# Patient Record
Sex: Female | Born: 1965 | Race: White | Hispanic: No | Marital: Married | Smoking: Never smoker
Health system: Southern US, Community
[De-identification: ages and names within clinical notes are randomized; demographics above are authoritative.]

## PROBLEM LIST (undated history)

## (undated) DIAGNOSIS — K625 Hemorrhage of anus and rectum: Secondary | ICD-10-CM

## (undated) DIAGNOSIS — E785 Hyperlipidemia, unspecified: Secondary | ICD-10-CM

## (undated) DIAGNOSIS — M1990 Unspecified osteoarthritis, unspecified site: Secondary | ICD-10-CM

## (undated) DIAGNOSIS — K649 Unspecified hemorrhoids: Secondary | ICD-10-CM

## (undated) DIAGNOSIS — I10 Essential (primary) hypertension: Secondary | ICD-10-CM

## (undated) DIAGNOSIS — K6289 Other specified diseases of anus and rectum: Secondary | ICD-10-CM

## (undated) DIAGNOSIS — F329 Major depressive disorder, single episode, unspecified: Secondary | ICD-10-CM

## (undated) DIAGNOSIS — F32A Depression, unspecified: Secondary | ICD-10-CM

## (undated) DIAGNOSIS — Z8669 Personal history of other diseases of the nervous system and sense organs: Secondary | ICD-10-CM

## (undated) DIAGNOSIS — F419 Anxiety disorder, unspecified: Secondary | ICD-10-CM

## (undated) HISTORY — DX: Anxiety disorder, unspecified: F41.9

## (undated) HISTORY — DX: Hyperlipidemia, unspecified: E78.5

## (undated) HISTORY — DX: Essential (primary) hypertension: I10

## (undated) HISTORY — DX: Unspecified hemorrhoids: K64.9

## (undated) HISTORY — DX: Unspecified osteoarthritis, unspecified site: M19.90

## (undated) HISTORY — DX: Major depressive disorder, single episode, unspecified: F32.9

## (undated) HISTORY — DX: Other specified diseases of anus and rectum: K62.89

## (undated) HISTORY — DX: Hemorrhage of anus and rectum: K62.5

## (undated) HISTORY — DX: Personal history of other diseases of the nervous system and sense organs: Z86.69

## (undated) HISTORY — DX: Depression, unspecified: F32.A

---
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
## 1995-05-31 HISTORY — PX: CHOLECYSTECTOMY: SHX55

## 1997-05-30 HISTORY — PX: TONSILLECTOMY AND ADENOIDECTOMY: SUR1326

## 1998-04-09 ENCOUNTER — Ambulatory Visit (HOSPITAL_BASED_OUTPATIENT_CLINIC_OR_DEPARTMENT_OTHER): Admission: RE | Admit: 1998-04-09 | Discharge: 1998-04-09 | Payer: Self-pay | Admitting: Otolaryngology

## 1998-10-01 ENCOUNTER — Other Ambulatory Visit: Admission: RE | Admit: 1998-10-01 | Discharge: 1998-10-01 | Payer: Self-pay | Admitting: Obstetrics and Gynecology

## 1998-12-29 ENCOUNTER — Emergency Department (HOSPITAL_COMMUNITY): Admission: EM | Admit: 1998-12-29 | Discharge: 1998-12-29 | Payer: Self-pay | Admitting: Emergency Medicine

## 1999-10-29 ENCOUNTER — Other Ambulatory Visit: Admission: RE | Admit: 1999-10-29 | Discharge: 1999-10-29 | Payer: Self-pay | Admitting: Obstetrics and Gynecology

## 1999-11-22 ENCOUNTER — Encounter: Payer: Self-pay | Admitting: Obstetrics and Gynecology

## 1999-11-22 ENCOUNTER — Ambulatory Visit (HOSPITAL_COMMUNITY): Admission: RE | Admit: 1999-11-22 | Discharge: 1999-11-22 | Payer: Self-pay | Admitting: Obstetrics and Gynecology

## 2000-03-21 ENCOUNTER — Ambulatory Visit (HOSPITAL_COMMUNITY): Admission: RE | Admit: 2000-03-21 | Discharge: 2000-03-21 | Payer: Self-pay | Admitting: Anesthesiology

## 2001-01-23 ENCOUNTER — Encounter: Admission: RE | Admit: 2001-01-23 | Discharge: 2001-01-23 | Payer: Self-pay | Admitting: Obstetrics and Gynecology

## 2001-01-23 ENCOUNTER — Encounter: Payer: Self-pay | Admitting: Obstetrics and Gynecology

## 2001-08-20 ENCOUNTER — Other Ambulatory Visit: Admission: RE | Admit: 2001-08-20 | Discharge: 2001-08-20 | Payer: Self-pay | Admitting: Obstetrics and Gynecology

## 2002-08-14 ENCOUNTER — Encounter: Admission: RE | Admit: 2002-08-14 | Discharge: 2002-11-12 | Payer: Self-pay | Admitting: Internal Medicine

## 2002-09-30 ENCOUNTER — Other Ambulatory Visit: Admission: RE | Admit: 2002-09-30 | Discharge: 2002-09-30 | Payer: Self-pay | Admitting: Obstetrics and Gynecology

## 2003-05-31 HISTORY — PX: LAPAROSCOPIC GASTRIC BANDING: SHX1100

## 2003-11-10 ENCOUNTER — Other Ambulatory Visit: Admission: RE | Admit: 2003-11-10 | Discharge: 2003-11-10 | Payer: Self-pay | Admitting: Obstetrics and Gynecology

## 2003-11-11 ENCOUNTER — Encounter: Admission: RE | Admit: 2003-11-11 | Discharge: 2003-11-11 | Payer: Self-pay | Admitting: Obstetrics and Gynecology

## 2003-11-17 ENCOUNTER — Encounter: Admission: RE | Admit: 2003-11-17 | Discharge: 2003-11-17 | Payer: Self-pay | Admitting: *Deleted

## 2003-11-25 ENCOUNTER — Encounter: Admission: RE | Admit: 2003-11-25 | Discharge: 2004-02-23 | Payer: Self-pay | Admitting: *Deleted

## 2003-12-09 ENCOUNTER — Observation Stay (HOSPITAL_COMMUNITY): Admission: RE | Admit: 2003-12-09 | Discharge: 2003-12-10 | Payer: Self-pay | Admitting: *Deleted

## 2004-08-26 ENCOUNTER — Encounter: Admission: RE | Admit: 2004-08-26 | Discharge: 2004-11-24 | Payer: Self-pay | Admitting: *Deleted

## 2004-10-29 ENCOUNTER — Ambulatory Visit (HOSPITAL_COMMUNITY): Admission: RE | Admit: 2004-10-29 | Discharge: 2004-10-29 | Payer: Self-pay | Admitting: Orthopedic Surgery

## 2005-08-22 ENCOUNTER — Encounter: Admission: RE | Admit: 2005-08-22 | Discharge: 2005-08-22 | Payer: Self-pay | Admitting: Obstetrics and Gynecology

## 2005-09-20 ENCOUNTER — Other Ambulatory Visit: Admission: RE | Admit: 2005-09-20 | Discharge: 2005-09-20 | Payer: Self-pay | Admitting: Obstetrics and Gynecology

## 2008-06-18 ENCOUNTER — Encounter: Admission: RE | Admit: 2008-06-18 | Discharge: 2008-06-18 | Payer: Self-pay | Admitting: Obstetrics and Gynecology

## 2009-09-15 ENCOUNTER — Encounter (INDEPENDENT_AMBULATORY_CARE_PROVIDER_SITE_OTHER): Payer: Self-pay | Admitting: Obstetrics and Gynecology

## 2009-09-15 ENCOUNTER — Ambulatory Visit: Payer: Self-pay | Admitting: Vascular Surgery

## 2009-09-15 ENCOUNTER — Ambulatory Visit (HOSPITAL_COMMUNITY): Admission: RE | Admit: 2009-09-15 | Discharge: 2009-09-15 | Payer: Self-pay | Admitting: Obstetrics and Gynecology

## 2010-02-10 ENCOUNTER — Encounter: Admission: RE | Admit: 2010-02-10 | Discharge: 2010-02-10 | Payer: Self-pay | Admitting: Obstetrics and Gynecology

## 2010-05-04 ENCOUNTER — Encounter (HOSPITAL_COMMUNITY)
Admission: RE | Admit: 2010-05-04 | Discharge: 2010-06-29 | Payer: Self-pay | Source: Home / Self Care | Attending: Obstetrics and Gynecology | Admitting: Obstetrics and Gynecology

## 2010-06-20 ENCOUNTER — Encounter: Payer: Self-pay | Admitting: Obstetrics and Gynecology

## 2010-06-20 ENCOUNTER — Encounter: Payer: Self-pay | Admitting: Orthopedic Surgery

## 2010-08-17 ENCOUNTER — Ambulatory Visit: Payer: Self-pay | Admitting: Hematology & Oncology

## 2010-09-02 ENCOUNTER — Other Ambulatory Visit: Payer: Self-pay | Admitting: Hematology & Oncology

## 2010-09-02 ENCOUNTER — Ambulatory Visit (HOSPITAL_BASED_OUTPATIENT_CLINIC_OR_DEPARTMENT_OTHER): Payer: BC Managed Care – PPO | Admitting: Hematology & Oncology

## 2010-09-02 DIAGNOSIS — D509 Iron deficiency anemia, unspecified: Secondary | ICD-10-CM

## 2010-09-02 DIAGNOSIS — E282 Polycystic ovarian syndrome: Secondary | ICD-10-CM

## 2010-09-02 DIAGNOSIS — N920 Excessive and frequent menstruation with regular cycle: Secondary | ICD-10-CM

## 2010-09-02 DIAGNOSIS — F329 Major depressive disorder, single episode, unspecified: Secondary | ICD-10-CM

## 2010-09-02 LAB — IRON AND TIBC
%SAT: 24 % (ref 20–55)
TIBC: 362 ug/dL (ref 250–470)
UIBC: 276 ug/dL

## 2010-09-02 LAB — CBC WITH DIFFERENTIAL (CANCER CENTER ONLY)
BASO#: 0 10*3/uL (ref 0.0–0.2)
BASO%: 0.5 % (ref 0.0–2.0)
EOS%: 4.3 % (ref 0.0–7.0)
HCT: 43 % (ref 34.8–46.6)
LYMPH#: 2.2 10*3/uL (ref 0.9–3.3)
LYMPH%: 34.3 % (ref 14.0–48.0)
MCV: 88 fL (ref 81–101)
MONO#: 0.4 10*3/uL (ref 0.1–0.9)
MONO%: 6 % (ref 0.0–13.0)
RBC: 4.89 10*6/uL (ref 3.70–5.32)
WBC: 6.5 10*3/uL (ref 3.9–10.0)

## 2010-09-02 LAB — RETICULOCYTES (CHCC): Retic Ct Pct: 1 % (ref 0.4–3.1)

## 2010-09-02 LAB — FERRITIN: Ferritin: 130 ng/mL (ref 10–291)

## 2010-09-02 LAB — CHCC SATELLITE - SMEAR

## 2010-09-03 ENCOUNTER — Other Ambulatory Visit: Payer: Self-pay | Admitting: Hematology & Oncology

## 2010-09-03 LAB — TSH: TSH: 1.546 u[IU]/mL (ref 0.350–4.500)

## 2010-10-15 NOTE — Op Note (Signed)
NAME:  Susan Thompson, Susan Thompson                         ACCOUNT NO.:  [id number]   MEDICAL RECORD NO.:  [id number]                   PATIENT TYPE:  OBV   LOCATION:  0460                                 FACILITY:  [facility]   PHYSICIAN:  Vikki Ports, M.D.         DATE OF BIRTH:  Dec 07, 1965   DATE OF PROCEDURE:  12/09/2003  DATE OF DISCHARGE:  12/10/2003                                 OPERATIVE REPORT   PREOPERATIVE DIAGNOSES:  1. Morbid obesity.  2. Hiatal hernia.   POSTOPERATIVE DIAGNOSES:  1. Morbid obesity.  2. Hiatal hernia.   PROCEDURE:  Laparoscopic adjustable gastric banding and hiatal hernia  repair.   SURGEON:  Vikki Ports, M.D.   ASSISTANT:  Thornton Park. Daphine Deutscher, M.D.   ANESTHESIA:  General.   DESCRIPTION OF PROCEDURE:  The patient was taken to the operating room,  placed in the supine position and after adequate general anesthesia was  induced using endotracheal tube, the abdomen was prepped and draped in the  normal sterile fashion.  Using a 5 mm Optiview trocar, access was obtained  in the left upper quadrant. Pneumoperitoneum was obtained.  Under direct  visualization, an additional 10 mm port was placed in the right upper  quadrant and a 5 mm port was placed in the right upper quadrant. A 12 mm  port was placed in the right supraumbilical region. An additional 5 mm port  was placed in the left abdomen. A subxiphoid 5 mm incision was made and  Nathanson retractor was introduced into the abdomen. The left lateral  segment of the liver was retracted anteriorly. The angle of His was easily  identified and dissected with sharp and blunt dissection.  I then turned my  attention to the right crus and a plane anterior to the right crus and  posterior to the esophagus was traversed with the lap band passer. The band  was then placed in the abdomen through the 12 mm incision, placed in the lap  band passer and brought around behind the esophagus. It was closed over  the  dilating balloon, insufflated to 10 mL.  It snapped closed nicely and had  easy mobility. The balloon was then removed. The band was then retracted  anteriorly giving good visualization of both crus. The hiatal hernia was  visualized and closed with an #0 Ethibond figure-of-eight suture.  The band  was then sutured in place by three separate 2-0 Ethibond sutures  approximating the proximal pouch to the distal stomach in a serosal fashion.  This left clear visualization of the buckle away from the  last stitch. The  band tubing was then brought out through the 12 mm trocar site. A Nathanson  retractor was removed and trocars were removed.  The incision was extended  laterally and a pocket was created on the anterior rectus fascia. The port  was intact in the fascia with interrupted 2-0 Prolene sutures. The port lay  nicely flat against the fascia. The  incision was closed with a subcutaneous 3-0 Vicryl and incisions were closed  with staples and injected with Marcaine. A sterile dressing was applied. The  patient tolerated the procedure well and went to PACU in good condition.                                               Vikki Ports, M.D.    KRH/MEDQ  D:  12/10/2003  T:  12/10/2003  Job:  [id number]

## 2011-04-28 ENCOUNTER — Other Ambulatory Visit: Payer: Self-pay | Admitting: Obstetrics and Gynecology

## 2011-04-28 DIAGNOSIS — Z1231 Encounter for screening mammogram for malignant neoplasm of breast: Secondary | ICD-10-CM

## 2011-06-02 ENCOUNTER — Ambulatory Visit: Payer: BC Managed Care – PPO

## 2011-08-17 ENCOUNTER — Ambulatory Visit (INDEPENDENT_AMBULATORY_CARE_PROVIDER_SITE_OTHER): Payer: BC Managed Care – PPO | Admitting: Physician Assistant

## 2011-08-17 VITALS — BP 130/78 | HR 84 | Temp 98.4°F | Resp 20 | Ht 65.0 in | Wt 212.0 lb

## 2011-08-17 DIAGNOSIS — L299 Pruritus, unspecified: Secondary | ICD-10-CM

## 2011-08-17 MED ORDER — PREDNISONE (PAK) 10 MG PO TABS: ORAL_TABLET | ORAL | Status: DC

## 2011-08-17 MED ORDER — HYDROXYZINE HCL 25 MG PO TABS: 25.0000 mg | ORAL_TABLET | Freq: Three times a day (TID) | ORAL | Status: AC | PRN

## 2011-08-17 MED ORDER — EPINEPHRINE 0.3 MG/0.3ML IJ DEVI: 0.3000 mg | Freq: Once | INTRAMUSCULAR | Status: DC

## 2011-08-17 MED ORDER — METHYLPREDNISOLONE SODIUM SUCC 125 MG IJ SOLR
125.0000 mg | Freq: Once | INTRAMUSCULAR | Status: AC
  Administered 2011-08-17: 125 mg via INTRAMUSCULAR

## 2011-08-17 NOTE — Progress Notes (Signed)
  Subjective:    Patient ID: Susan Thompson, female    DOB: 1965-07-04, 46 y.o.   MRN: [id number]  HPI Susan Thompson comes in today for 3 days of itching all over. No rash except after she scratches a lot.  Has taken benadryl with some relief.  She has not had itching like this before.  No swelling in mouth or SOB.     Review of Systems Itching    Objective:   Physical Exam  Constitutional: She appears well-developed and well-nourished.  HENT:  Mouth/Throat: Oropharynx is clear and moist.  Pulmonary/Chest: Effort normal and breath sounds normal.  Skin: Skin is warm. There is erythema (where scratches.  Positive dermatographism).        Assessment & Plan:  Pruitis Dermatographism  Zyrtec once a day Atarax 25 mg at night prn Zantac bid Soulmedrol 125 Im no Prednisone 6 day taper if no relief 24 hours Pt would like Epi pen after we discussed.

## 2011-08-17 NOTE — Patient Instructions (Signed)
Take Zyrtec once a day in the morning. Take Zantac 150 mg every 12 hours. Take prednisone if symptoms are not better in 1 day

## 2012-05-31 ENCOUNTER — Encounter (INDEPENDENT_AMBULATORY_CARE_PROVIDER_SITE_OTHER): Payer: Self-pay | Admitting: General Surgery

## 2012-05-31 ENCOUNTER — Ambulatory Visit (INDEPENDENT_AMBULATORY_CARE_PROVIDER_SITE_OTHER): Payer: BC Managed Care – PPO | Admitting: General Surgery

## 2012-05-31 VITALS — BP 132/80 | HR 72 | Temp 97.8°F | Resp 18 | Ht 65.0 in | Wt 201.5 lb

## 2012-05-31 DIAGNOSIS — K602 Anal fissure, unspecified: Secondary | ICD-10-CM | POA: Insufficient documentation

## 2012-05-31 MED ORDER — POLYETHYLENE GLYCOL 3350 17 GM/SCOOP PO POWD: 17.0000 g | Freq: Every day | ORAL | Status: DC

## 2012-05-31 MED ORDER — LIDOCAINE HCL 2 % EX GEL: CUTANEOUS | Status: AC

## 2012-05-31 NOTE — Assessment & Plan Note (Signed)
Diltiazem cream 4 times/day.    Add miralax Sitz baths Stool softeners as she has been doing.  Follow up with Dr. Abbey Chatters or Dr. Maisie Fus.

## 2012-05-31 NOTE — Patient Instructions (Signed)
Sitz baths 20 min twice daily.    Continue stool softeners 2 x day  Add miralax 17 gm daily  Diltiazem cream 4 x daily.  Lidocaine jelly as needed.    8 glasses water/day.

## 2012-05-31 NOTE — Progress Notes (Signed)
Patient ID: Susan Thompson, female   DOB: 08-09-1965, 47 y.o.   MRN: [id number]  Chief Complaint  Patient presents with  . Follow-up    Hemorrhoids    HPI Susan Thompson is a 47 y.o. female.    HPI Pt presents with 2 weeks of severe anal pain.  She has had some constipation, but has started taking stool softeners.  She tried proctofoam, but this did not help.  She has had minimal bleeding.  She tried a few sitz baths as well.  She has had hemorrhoids for a long time and has trouble cleaning, but has never had severe pain like this before.  She has not been spending a long time on the toilet recently, but did have severe constipation at the onset of the pain.     Past Medical History  Diagnosis Date  . Hemorrhoids   . Arthritis   . Hyperlipidemia   . Rectal pain   . History of migraine   . Rectal bleeding     Past Surgical History  Procedure Date  . Appendectomy 1982  . Cholecystectomy 1997  . Laparoscopic gastric banding 2005  . Tonsillectomy and adenoidectomy 1999    History reviewed. No pertinent family history.  Social History History  Substance Use Topics  . Smoking status: Never Smoker   . Smokeless tobacco: Not on file  . Alcohol Use: Yes     Comment: rarely    Allergies  Allergen Reactions  . Flagyl (Metronidazole Hcl) Hives    Hives will spread all over the body. Had to use epi pen to resolve because the hives were so bad. Breathing not affected.    Current Outpatient Prescriptions  Medication Sig Dispense Refill  . norgestrel-ethinyl estradiol (CRYSELLE-28) 0.3-30 MG-MCG tablet Take 1 tablet by mouth daily.      Marland Kitchen EPINEPHrine (EPI-PEN) 0.3 mg/0.3 mL DEVI Inject 0.3 mLs (0.3 mg total) into the muscle once.  1 Device  0  . lidocaine (XYLOCAINE JELLY) 2 % jelly Apply to affected area daily prn  30 mL  1  . polyethylene glycol powder (MIRALAX) powder Take 17 g by mouth daily. To correct constipation.  Adjust dose over 1-2 months.  Goal = ~1 bowel movement /  day  255 g  0  . spironolactone (ALDACTONE) 50 MG tablet Take 50 mg by mouth daily.      Marland Kitchen venlafaxine (EFFEXOR-XR) 150 MG 24 hr capsule Take 150 mg by mouth daily.        Review of Systems Review of Systems  Gastrointestinal: Positive for blood in stool and rectal pain.  Neurological: Positive for headaches.  All other systems reviewed and are negative.    Blood pressure 132/80, pulse 72, temperature 97.8 F (36.6 C), temperature source Temporal, resp. rate 18, height 5\' 5"  (1.651 m), weight 201 lb 8 oz (91.4 kg).  Physical Exam Physical Exam  Constitutional: She is oriented to person, place, and time. She appears well-developed and well-nourished. No distress.  HENT:  Head: Normocephalic and atraumatic.  Eyes: Pupils are equal, round, and reactive to light.  Cardiovascular: Normal rate.   Pulmonary/Chest: No respiratory distress.  Genitourinary:          Large anterior external hemorrhoids.  Pt able to tolerate digital exam with minimal discomfort  Anoscopy reveals right posterolateral fissure fairly deep in anal canal and circumferential internal hemorrhoids.    Musculoskeletal: Normal range of motion.  Neurological: She is alert and oriented to person, place, and time.  No cranial nerve deficit.  Skin: She is not diaphoretic.  Psychiatric: She has a normal mood and affect. Her behavior is normal. Judgment and thought content normal.    Assessment/Plan Anal fissure Diltiazem cream 4 times/day.    Add miralax Sitz baths Stool softeners as she has been doing.  Follow up with Dr. Abbey Chatters or Dr. Maisie Fus.      [name] 05/31/2012, 4:23 PM

## 2012-06-17 ENCOUNTER — Other Ambulatory Visit (INDEPENDENT_AMBULATORY_CARE_PROVIDER_SITE_OTHER): Payer: Self-pay | Admitting: General Surgery

## 2012-06-22 ENCOUNTER — Telehealth (INDEPENDENT_AMBULATORY_CARE_PROVIDER_SITE_OTHER): Payer: Self-pay | Admitting: General Surgery

## 2012-06-22 NOTE — Telephone Encounter (Signed)
Pt requesting refill on polyethylene glycol.  Refill request faxed to [facility] w/ 3 additional refills.

## 2012-09-06 ENCOUNTER — Other Ambulatory Visit (INDEPENDENT_AMBULATORY_CARE_PROVIDER_SITE_OTHER): Payer: Self-pay | Admitting: General Surgery

## 2013-07-13 ENCOUNTER — Encounter (HOSPITAL_BASED_OUTPATIENT_CLINIC_OR_DEPARTMENT_OTHER): Payer: Self-pay | Admitting: Emergency Medicine

## 2013-07-13 ENCOUNTER — Emergency Department (HOSPITAL_BASED_OUTPATIENT_CLINIC_OR_DEPARTMENT_OTHER)
Admission: EM | Admit: 2013-07-13 | Discharge: 2013-07-13 | Disposition: A | Discharge status: Home / Self Care | Payer: BC Managed Care – PPO | Attending: Emergency Medicine | Admitting: Emergency Medicine

## 2013-07-13 DIAGNOSIS — Z9089 Acquired absence of other organs: Secondary | ICD-10-CM | POA: Insufficient documentation

## 2013-07-13 DIAGNOSIS — R1110 Vomiting, unspecified: Secondary | ICD-10-CM

## 2013-07-13 DIAGNOSIS — Z9889 Other specified postprocedural states: Secondary | ICD-10-CM | POA: Insufficient documentation

## 2013-07-13 DIAGNOSIS — Z3202 Encounter for pregnancy test, result negative: Secondary | ICD-10-CM | POA: Insufficient documentation

## 2013-07-13 DIAGNOSIS — Z8719 Personal history of other diseases of the digestive system: Secondary | ICD-10-CM | POA: Insufficient documentation

## 2013-07-13 DIAGNOSIS — Z8739 Personal history of other diseases of the musculoskeletal system and connective tissue: Secondary | ICD-10-CM | POA: Insufficient documentation

## 2013-07-13 DIAGNOSIS — Z862 Personal history of diseases of the blood and blood-forming organs and certain disorders involving the immune mechanism: Secondary | ICD-10-CM | POA: Insufficient documentation

## 2013-07-13 DIAGNOSIS — R5383 Other fatigue: Secondary | ICD-10-CM

## 2013-07-13 DIAGNOSIS — N390 Urinary tract infection, site not specified: Secondary | ICD-10-CM | POA: Insufficient documentation

## 2013-07-13 DIAGNOSIS — R5381 Other malaise: Secondary | ICD-10-CM | POA: Insufficient documentation

## 2013-07-13 DIAGNOSIS — Z79899 Other long term (current) drug therapy: Secondary | ICD-10-CM | POA: Insufficient documentation

## 2013-07-13 DIAGNOSIS — Z8679 Personal history of other diseases of the circulatory system: Secondary | ICD-10-CM | POA: Insufficient documentation

## 2013-07-13 DIAGNOSIS — Z8639 Personal history of other endocrine, nutritional and metabolic disease: Secondary | ICD-10-CM | POA: Insufficient documentation

## 2013-07-13 LAB — URINALYSIS, ROUTINE W REFLEX MICROSCOPIC
GLUCOSE, UA: NEGATIVE mg/dL
Hgb urine dipstick: NEGATIVE
Ketones, ur: 15 mg/dL — AB
NITRITE: NEGATIVE
PROTEIN: NEGATIVE mg/dL
Specific Gravity, Urine: 1.026 (ref 1.005–1.030)
UROBILINOGEN UA: 0.2 mg/dL (ref 0.0–1.0)
pH: 5 (ref 5.0–8.0)

## 2013-07-13 LAB — COMPREHENSIVE METABOLIC PANEL
ALBUMIN: 4 g/dL (ref 3.5–5.2)
ALT: 11 U/L (ref 0–35)
AST: 13 U/L (ref 0–37)
Alkaline Phosphatase: 91 U/L (ref 39–117)
BILIRUBIN TOTAL: 0.5 mg/dL (ref 0.3–1.2)
BUN: 16 mg/dL (ref 6–23)
CALCIUM: 9.5 mg/dL (ref 8.4–10.5)
CO2: 20 meq/L (ref 19–32)
CREATININE: 0.7 mg/dL (ref 0.50–1.10)
Chloride: 100 mEq/L (ref 96–112)
GFR calc Af Amer: >90 mL/min (ref >90)
GFR calc non Af Amer: >90 mL/min (ref >90)
GLUCOSE: 132 mg/dL — AB (ref 70–99)
Potassium: 3.8 mEq/L (ref 3.7–5.3)
SODIUM: 139 meq/L (ref 137–147)
Total Protein: 8.3 g/dL (ref 6.0–8.3)

## 2013-07-13 LAB — CBC WITH DIFFERENTIAL/PLATELET
BASOS PCT: 0 % (ref 0–1)
Basophils Absolute: 0 10*3/uL (ref 0.0–0.1)
Eosinophils Absolute: 0.2 10*3/uL (ref 0.0–0.7)
Eosinophils Relative: 1 % (ref 0–5)
HCT: 44.4 % (ref 36.0–46.0)
HEMOGLOBIN: 14.6 g/dL (ref 12.0–15.0)
LYMPHS PCT: 4 % — AB (ref 12–46)
Lymphs Abs: 0.7 10*3/uL (ref 0.7–4.0)
MCH: 27.2 pg (ref 26.0–34.0)
MCHC: 32.9 g/dL (ref 30.0–36.0)
MCV: 82.8 fL (ref 78.0–100.0)
MONO ABS: 0.9 10*3/uL (ref 0.1–1.0)
MONOS PCT: 6 % (ref 3–12)
NEUTROS ABS: 13.8 10*3/uL — AB (ref 1.7–7.7)
Neutrophils Relative %: 89 % — ABNORMAL HIGH (ref 43–77)
Platelets: 332 10*3/uL (ref 150–400)
RBC: 5.36 MIL/uL — AB (ref 3.87–5.11)
RDW: 15.6 % — ABNORMAL HIGH (ref 11.5–15.5)
WBC: 15.5 10*3/uL — AB (ref 4.0–10.5)

## 2013-07-13 LAB — URINE MICROSCOPIC-ADD ON

## 2013-07-13 LAB — LIPASE, BLOOD: LIPASE: 44 U/L (ref 11–59)

## 2013-07-13 LAB — PREGNANCY, URINE: PREG TEST UR: NEGATIVE

## 2013-07-13 MED ORDER — SULFAMETHOXAZOLE-TRIMETHOPRIM 800-160 MG PO TABS: 1.0000 | ORAL_TABLET | Freq: Two times a day (BID) | ORAL | Status: AC

## 2013-07-13 MED ORDER — ONDANSETRON HCL 4 MG/2ML IJ SOLN
4.0000 mg | Freq: Once | INTRAMUSCULAR | Status: AC
  Administered 2013-07-13: 4 mg via INTRAVENOUS
  Filled 2013-07-13: qty 2

## 2013-07-13 MED ORDER — FENTANYL CITRATE 0.05 MG/ML IJ SOLN
50.0000 ug | Freq: Once | INTRAMUSCULAR | Status: AC
  Administered 2013-07-13: 50 ug via INTRAVENOUS
  Filled 2013-07-13: qty 2

## 2013-07-13 MED ORDER — ONDANSETRON HCL 4 MG PO TABS: 4.0000 mg | ORAL_TABLET | Freq: Three times a day (TID) | ORAL | Status: DC | PRN

## 2013-07-13 MED ORDER — SULFAMETHOXAZOLE-TMP DS 800-160 MG PO TABS
1.0000 | ORAL_TABLET | Freq: Once | ORAL | Status: AC
  Administered 2013-07-13: 1 via ORAL
  Filled 2013-07-13: qty 1

## 2013-07-13 NOTE — ED Notes (Signed)
Onset of nausea, generalized weakness, dry heaving this afternoon.  Unsure of fever.  Denies chest pain, diarrhea.  Reports upper abdominal pain which began after the nausea.

## 2013-07-13 NOTE — ED Provider Notes (Signed)
CSN: [id number]     Arrival date & time 07/13/13  1950 History  This chart was scribed for Susan Thompson B. Bernette MayersSheldon, MD by Danella Maiersaroline Early, ED Scribe. This patient was seen in room MH10/MH10 and the patient's care was started at 9:34 PM.    Chief Complaint  Patient presents with  . Nausea   The history is provided by the patient. No language interpreter was used.   HPI Comments: Susan ForthLeslie B Thompson is a 48 y.o. female who presents to the Emergency Department complaining of sudden-onset nausea, dry-heaving, and generalized weakness onset this afternoon a couple hours after eating. She reports upper abdominal pain that began after the initial nausea that she is still having currently. She has been feeling fine the last couple of days and denies pain or nausea after eating before today. She has a h/o appendectomy, cholecystectomy, and lap band. She denies CP, diarrhea, blood in stools, dysuria, rash, fever.   Past Medical History  Diagnosis Date  . Hemorrhoids   . Arthritis   . Hyperlipidemia   . Rectal pain   . History of migraine   . Rectal bleeding    Past Surgical History  Procedure Laterality Date  . Appendectomy  1982  . Cholecystectomy  1997  . Laparoscopic gastric banding  2005  . Tonsillectomy and adenoidectomy  1999   No family history on file. History  Substance Use Topics  . Smoking status: Never Smoker   . Smokeless tobacco: Not on file  . Alcohol Use: Yes     Comment: rarely   OB History   Grav Para Term Preterm Abortions TAB SAB Ect Mult Living                 Review of Systems  Constitutional: Negative for fever.  Cardiovascular: Negative for chest pain.  Gastrointestinal: Positive for nausea, vomiting (dry heaving) and abdominal pain. Negative for diarrhea and blood in stool.  Genitourinary: Negative for dysuria.  Skin: Negative for rash.  Neurological: Positive for weakness.   A complete 10 system review of systems was obtained and all systems are negative except  as noted in the HPI and PMH.     Allergies  Flagyl  Home Medications   Current Outpatient Rx  Name  Route  Sig  Dispense  Refill  . metFORMIN (GLUCOPHAGE) 1000 MG tablet   Oral   Take 1,000 mg by mouth 2 (two) times daily with a meal.         . EPINEPHrine (EPI-PEN) 0.3 mg/0.3 mL DEVI   Intramuscular   Inject 0.3 mLs (0.3 mg total) into the muscle once.   1 Device   0   . norgestrel-ethinyl estradiol (CRYSELLE-28) 0.3-30 MG-MCG tablet   Oral   Take 1 tablet by mouth daily.         . polyethylene glycol powder (GLYCOLAX/MIRALAX) powder      TAKE 17 GRAMS BY MOUTH DAILY TO CORRECT CONSTIPATION   255 g   3   . spironolactone (ALDACTONE) 50 MG tablet   Oral   Take 50 mg by mouth daily.         Marland Kitchen. venlafaxine (EFFEXOR-XR) 150 MG 24 hr capsule   Oral   Take 150 mg by mouth daily.          BP 113/79  Pulse 86  Temp(Src) 97.9 F (36.6 C) (Oral)  Resp 20  Ht 5\' 4"  (1.626 m)  Wt 200 lb (90.719 kg)  BMI 34.31 kg/m2  SpO2 98%  LMP 07/01/2013 Physical Exam  Nursing note and vitals reviewed. Constitutional: She is oriented to person, place, and time. She appears well-developed and well-nourished.  HENT:  Head: Normocephalic and atraumatic.  Eyes: EOM are normal. Pupils are equal, round, and reactive to light.  Neck: Normal range of motion. Neck supple.  Cardiovascular: Normal rate, normal heart sounds and intact distal pulses.   Pulmonary/Chest: Effort normal and breath sounds normal.  Abdominal: Bowel sounds are normal. She exhibits no distension. There is tenderness (mild epigastric). There is no guarding.  Musculoskeletal: Normal range of motion. She exhibits no edema and no tenderness.  Neurological: She is alert and oriented to person, place, and time. She has normal strength. No cranial nerve deficit or sensory deficit.  Skin: Skin is warm and dry. No rash noted.  Psychiatric: She has a normal mood and affect.    ED Course  Procedures (including  critical care time) Medications  ondansetron (ZOFRAN) injection 4 mg (4 mg Intravenous Given 07/13/13 2112)   DIAGNOSTIC STUDIES: Oxygen Saturation is 98% on RA, normal by my interpretation.    COORDINATION OF CARE: 9:55 PM- Discussed treatment plan with pt. Pt agrees to plan.    Labs Review Labs Reviewed  CBC WITH DIFFERENTIAL - Abnormal; Notable for the following:    WBC 15.5 (*)    RBC 5.36 (*)    RDW 15.6 (*)    Neutrophils Relative % 89 (*)    Neutro Abs 13.8 (*)    Lymphocytes Relative 4 (*)    All other components within normal limits  COMPREHENSIVE METABOLIC PANEL - Abnormal; Notable for the following:    Glucose, Bld 132 (*)    All other components within normal limits  LIPASE, BLOOD  URINALYSIS, ROUTINE W REFLEX MICROSCOPIC  PREGNANCY, URINE   Imaging Review No results found.  EKG Interpretation    Date/Time:  Saturday July 13 2013 20:18:39 EST Ventricular Rate:  86 PR Interval:  162 QRS Duration: 88 QT Interval:  380 QTC Calculation: 454 R Axis:   57 Text Interpretation:  Normal sinus rhythm Cannot rule out Anterior infarct , age undetermined \\E \ Nonspecific ST abnormality Abnormal ECG Since last tracing Nonspecific ST abnormality III NOW PRESENT Confirmed by Bernette Mayers  MD, Charon Akamine [phone]) on 07/13/2013 8:20:58 PM            MDM   Final diagnoses:  UTI (urinary tract infection)  Vomiting   Labs unremarkable except for ?UTI in contaminated sample. Will send for culture, start Bactrim, Zofran for nausea at home. Pt is feeling much better now, tolerating ice chips and trying gingerale now. Has walked to the bathroom without dizziness.   I personally performed the services described in this documentation, which was scribed in my presence. The recorded information has been reviewed and is accurate.      Susan Alia B. Bernette Mayers, MD 07/13/13 2310

## 2013-07-13 NOTE — Discharge Instructions (Signed)
Nausea and Vomiting °Nausea is a sick feeling that often comes before throwing up (vomiting). Vomiting is a reflex where stomach contents come out of your mouth. Vomiting can cause severe loss of body fluids (dehydration). Children and elderly adults can become dehydrated quickly, especially if they also have diarrhea. Nausea and vomiting are symptoms of a condition or disease. It is important to find the cause of your symptoms. °CAUSES  °· Direct irritation of the stomach lining. This irritation can result from increased acid production (gastroesophageal reflux disease), infection, food poisoning, taking certain medicines (such as nonsteroidal anti-inflammatory drugs), alcohol use, or tobacco use. °· Signals from the brain. These signals could be caused by a headache, heat exposure, an inner ear disturbance, increased pressure in the brain from injury, infection, a tumor, or a concussion, pain, emotional stimulus, or metabolic problems. °· An obstruction in the gastrointestinal tract (bowel obstruction). °· Illnesses such as diabetes, hepatitis, gallbladder problems, appendicitis, kidney problems, cancer, sepsis, atypical symptoms of a heart attack, or eating disorders. °· Medical treatments such as chemotherapy and radiation. °· Receiving medicine that makes you sleep (general anesthetic) during surgery. °DIAGNOSIS °Your caregiver may ask for tests to be done if the problems do not improve after a few days. Tests may also be done if symptoms are severe or if the reason for the nausea and vomiting is not clear. Tests may include: °· Urine tests. °· Blood tests. °· Stool tests. °· Cultures (to look for evidence of infection). °· X-rays or other imaging studies. °Test results can help your caregiver make decisions about treatment or the need for additional tests. °TREATMENT °You need to stay well hydrated. Drink frequently but in small amounts. You may wish to drink water, sports drinks, clear broth, or eat frozen  ice pops or gelatin dessert to help stay hydrated. When you eat, eating slowly may help prevent nausea. There are also some antinausea medicines that may help prevent nausea. °HOME CARE INSTRUCTIONS  °· Take all medicine as directed by your caregiver. °· If you do not have an appetite, do not force yourself to eat. However, you must continue to drink fluids. °· If you have an appetite, eat a normal diet unless your caregiver tells you differently. °· Eat a variety of complex carbohydrates (rice, wheat, potatoes, bread), lean meats, yogurt, fruits, and vegetables. °· Avoid high-fat foods because they are more difficult to digest. °· Drink enough water and fluids to keep your urine clear or pale yellow. °· If you are dehydrated, ask your caregiver for specific rehydration instructions. Signs of dehydration may include: °· Severe thirst. °· Dry lips and mouth. °· Dizziness. °· Dark urine. °· Decreasing urine frequency and amount. °· Confusion. °· Rapid breathing or pulse. °SEEK IMMEDIATE MEDICAL CARE IF:  °· You have blood or brown flecks (like coffee grounds) in your vomit. °· You have black or bloody stools. °· You have a severe headache or stiff neck. °· You are confused. °· You have severe abdominal pain. °· You have chest pain or trouble breathing. °· You do not urinate at least once every 8 hours. °· You develop cold or clammy skin. °· You continue to vomit for longer than 24 to 48 hours. °· You have a fever. °MAKE SURE YOU:  °· Understand these instructions. °· Will watch your condition. °· Will get help right away if you are not doing well or get worse. °Document Released: 05/16/2005 Document Revised: 08/08/2011 Document Reviewed: 10/13/2010 °ExitCare® Patient Information ©2014 [employer]. ° °Urinary Tract Infection °Urinary   tract infections (UTIs) can develop anywhere along your urinary tract. Your urinary tract is your body's drainage system for removing wastes and extra water. Your urinary tract includes  two kidneys, two ureters, a bladder, and a urethra. Your kidneys are a pair of bean-shaped organs. Each kidney is about the size of your fist. They are located below your ribs, one on each side of your spine. °CAUSES °Infections are caused by microbes, which are microscopic organisms, including fungi, viruses, and bacteria. These organisms are so small that they can only be seen through a microscope. Bacteria are the microbes that most commonly cause UTIs. °SYMPTOMS  °Symptoms of UTIs may vary by age and gender of the patient and by the location of the infection. Symptoms in young women typically include a frequent and intense urge to urinate and a painful, burning feeling in the bladder or urethra during urination. Older women and men are more likely to be tired, shaky, and weak and have muscle aches and abdominal pain. A fever may mean the infection is in your kidneys. Other symptoms of a kidney infection include pain in your back or sides below the ribs, nausea, and vomiting. °DIAGNOSIS °To diagnose a UTI, your caregiver will ask you about your symptoms. Your caregiver also will ask to provide a urine sample. The urine sample will be tested for bacteria and white blood cells. White blood cells are made by your body to help fight infection. °TREATMENT  °Typically, UTIs can be treated with medication. Because most UTIs are caused by a bacterial infection, they usually can be treated with the use of antibiotics. The choice of antibiotic and length of treatment depend on your symptoms and the type of bacteria causing your infection. °HOME CARE INSTRUCTIONS °· If you were prescribed antibiotics, take them exactly as your caregiver instructs you. Finish the medication even if you feel better after you have only taken some of the medication. °· Drink enough water and fluids to keep your urine clear or pale yellow. °· Avoid caffeine, tea, and carbonated beverages. They tend to irritate your bladder. °· Empty your bladder  often. Avoid holding urine for long periods of time. °· Empty your bladder before and after sexual intercourse. °· After a bowel movement, women should cleanse from front to back. Use each tissue only once. °SEEK MEDICAL CARE IF:  °· You have back pain. °· You develop a fever. °· Your symptoms do not begin to resolve within 3 days. °SEEK IMMEDIATE MEDICAL CARE IF:  °· You have severe back pain or lower abdominal pain. °· You develop chills. °· You have nausea or vomiting. °· You have continued burning or discomfort with urination. °MAKE SURE YOU:  °· Understand these instructions. °· Will watch your condition. °· Will get help right away if you are not doing well or get worse. °Document Released: 02/23/2005 Document Revised: 11/15/2011 Document Reviewed: 06/24/2011 °ExitCare® Patient Information ©2014 [employer]. ° °

## 2013-07-13 NOTE — ED Notes (Signed)
Will obtain EKG d/t nausea, weakness, and upper abdominal pain.  Patient actively dry heaving in triage and does not appear well.

## 2013-07-15 LAB — URINE CULTURE
COLONY COUNT: NO GROWTH
CULTURE: NO GROWTH

## 2014-06-17 ENCOUNTER — Other Ambulatory Visit (HOSPITAL_COMMUNITY): Payer: BLUE CROSS/BLUE SHIELD | Attending: Psychiatry | Admitting: Psychiatry

## 2014-06-17 ENCOUNTER — Encounter (HOSPITAL_COMMUNITY): Payer: Self-pay

## 2014-06-17 DIAGNOSIS — E785 Hyperlipidemia, unspecified: Secondary | ICD-10-CM | POA: Insufficient documentation

## 2014-06-17 DIAGNOSIS — F331 Major depressive disorder, recurrent, moderate: Secondary | ICD-10-CM

## 2014-06-17 DIAGNOSIS — F411 Generalized anxiety disorder: Secondary | ICD-10-CM

## 2014-06-17 NOTE — Progress Notes (Signed)
Susan Thompson is a 49 y.o., married, employed, Caucasian female; who was referred per Jake SeatsLisa Poulus, NP and Phylliss BlakesGwen Auel, LCSW; treatment for ongoing depressive and anxiety symptoms.  Pt states her symptoms worsened in September 2015.  Symptoms include:  Tearfulness, ruminating thoughts, isolation, sadness, decreased self-esteem, poor appetite, lack of motivation, anxiety, anhedonia, irritability and feelings of hopelessness, helplessness and worthlessness.  Denies SI/Hi or A/V hallucinations.  Triggers/Stressors:  1)  Medical Issues:  PCOS. States her hair falling out.  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  States she loves her job.  Pt was fired from [employer] in 2007 due to absences. Denies any previous psychiatric admissions or suicide attempts.  Has been seeing Jake SeatsLisa Poulus, NP and Phylliss BlakesGwen Auel, LCSW since November 2015.  Family Hx:  Father (Depression) Childhood:  Born in [place], [place]  Pt was an only child.  States although she was a "daddy's girl," she always felt like she wasn't ever good enough.  Admits that she was overly sensitive as a child.  Reports that she was an "[employer] scientist" student.  "I was bullied because of my weight." Pt has been married for twenty two years.  Reports husband, mother and her 50 yr old son are her support system.  Denies drugs/ETOH or cigarettes.  Denies any DUI's or legal issues.  Pt completed all forms.  Scored 27 on the burns.  Pt will attend [facility] for ten days.  A:  Oriented pt.  Provided pt with an orientation folder.  Informed Jake SeatsLisa Poulus, NP and Phylliss BlakesGwen Auel, LCSW of admit.  Encouraged support groups.  [facility] for groups.  R:  Pt receptive.

## 2014-06-17 NOTE — Progress Notes (Signed)
Psychiatric Assessment Adult  Patient Identification:  PAOLA ALESHIRE Date of Evaluation:  06/17/2014 Chief Complaint: depression responding partially to medication and therapy History of Chief Complaint: Ms Araque says she has been depressed and anxious all her life.  She has recently since November 2015 seen a new therapist and psychiatric provider and both the medication and therapy are helping .  Says she wants to take some time to see if she can get better as she still has depressed mood, trouble getting out of the house, feeling guilty, poor appetite, poor motivation, loss of interest in usual activities and not enjoying her life.  Things in her life are good, she loves her husband and children and her job and has friends.  Anxiety is a major issue in that she worries about everything all the time and cannot stop thinking at night.  She does not want to take any more medication. She has never been suicidal.  Chief Complaint  Patient presents with  . Depression  . Anxiety    Anxiety     Review of Systems Physical Exam  Depressive Symptoms: depressed mood,  (Hypo) Manic Symptoms:   Elevated Mood:  Negative Irritable Mood:  Yes Grandiosity:  Negative Distractibility:  Negative Labiality of Mood:  Negative Delusions:  Negative Hallucinations:  Negative Impulsivity:  Negative Sexually Inappropriate Behavior:  Negative Financial Extravagance:  Negative Flight of Ideas:  Negative  Anxiety Symptoms: Excessive Worry:  Yes Panic Symptoms:  Negative Agoraphobia:  Negative Obsessive Compulsive: Negative  Symptoms: None, Specific Phobias:  spiders Social Anxiety:  Negative  Psychotic Symptoms:  Hallucinations: Negative None Delusions:  Negative Paranoia:  Negative   Ideas of Reference:  Negative  PTSD Symptoms: Ever had a traumatic exposure:  Negative Had a traumatic exposure in the last month:  Negative Re-experiencing: Negative None Hypervigilance:   Negative Hyperarousal: Negative None Avoidance: Negative None  Traumatic Brain Injury: Negative na  Past Psychiatric History: Diagnosis: major depression, recurrent, moderate.  Generalized anxiety disorder  Hospitalizations: none  Outpatient Care: sees psychiatric provider and therapist  Substance Abuse Care: none  Self-Mutilation: none  Suicidal Attempts: none  Violent Behaviors: none   Past Medical History:   Past Medical History  Diagnosis Date  . Hemorrhoids   . Arthritis   . Hyperlipidemia   . Rectal pain   . History of migraine   . Rectal bleeding   . Anxiety   . Depression    History of Loss of Consciousness:  Negative Seizure History:  Negative Cardiac History:  Negative Allergies:   Allergies  Allergen Reactions  . Flagyl [Metronidazole Hcl] Hives    Hives will spread all over the body. Had to use epi pen to resolve because the hives were so bad. Breathing not affected.   Current Medications:  Current Outpatient Prescriptions  Medication Sig Dispense Refill  . buPROPion (WELLBUTRIN SR) 150 MG 12 hr tablet Take 150 mg by mouth 2 (two) times daily.    Marland Kitchen EPINEPHrine (EPI-PEN) 0.3 mg/0.3 mL DEVI Inject 0.3 mLs (0.3 mg total) into the muscle once. 1 Device 0  . metFORMIN (GLUCOPHAGE) 1000 MG tablet Take 1,000 mg by mouth 2 (two) times daily with a meal.    . polyethylene glycol powder (GLYCOLAX/MIRALAX) powder TAKE 17 GRAMS BY MOUTH DAILY TO CORRECT CONSTIPATION 255 g 3  . venlafaxine (EFFEXOR-XR) 150 MG 24 hr capsule Take 150 mg by mouth 2 (two) times daily.     . [DISCONTINUED] norgestrel-ethinyl estradiol (CRYSELLE-28) 0.3-30 MG-MCG tablet Take 1  tablet by mouth daily.     No current facility-administered medications for this visit.    Previous Psychotropic Medications:  Medication Dose   see above  na                     Substance Abuse History in the last 12 months:none Substance Age of 1st Use Last Use Amount Specific Type                                                                                               Medical Consequences of Substance Abuse: none  Legal Consequences of Substance Abuse: none  Family Consequences of Substance Abuse: none  Blackouts:  Negative DT's:  Negative Withdrawal Symptoms:  Negative None  Social History: Current Place of Residence: [place] Place of Birth: [place] Family Members: lives with husband, son is out of the house Marital Status:  Married Children: 1  Sons: 1  Daughters: 0 Relationships: good with family Education:  [employer] Educational Problems/Performance: adequate she says Religious Beliefs/Practices: describes herself as religious History of Abuse: none Teacher, musicccupational Experiences; Military History:  None. Legal History: none Hobbies/Interests: none reported  Family History:   Family History  Problem Relation Age of Onset  . Depression Father     Mental Status Examination/Evaluation: Objective:  Appearance: Well Groomed  Eye Contact::  Good  Speech:  Clear and Coherent  Volume:  Normal  Mood:  depressed  Affect:  Appropriate  Thought Process:  Coherent and Logical  Orientation:  Full (Time, Place, and Person)  Thought Content:  Negative  Suicidal Thoughts:  No  Homicidal Thoughts:  No  Judgement:  Good  Insight:  Good  Psychomotor Activity:  Normal  Akathisia:  Negative  Handed:  Right  AIMS (if indicated):  0  Assets:  Communication Skills Desire for Improvement Financial Resources/Insurance Housing Intimacy Leisure Time Physical Health Resilience Social Support Talents/Skills Transportation Vocational/Educational    Laboratory/X-Ray Psychological Evaluation(s)   none  none   Assessment:  Major depression, recurrent, moderate. Generalized Anxiety Disorder                  Treatment Plan/Recommendations:  Plan of Care: daily group  Laboratory:  none  Psychotherapy: daily group  Medications: continue current meds  Routine PRN  Medications:  Yes  Consultations: none  Safety Concerns:  none  Other:      Benjaman PottAYLOR,GERALD D, MD 1/19/201612:19 PM

## 2014-06-18 ENCOUNTER — Other Ambulatory Visit (HOSPITAL_COMMUNITY): Payer: BLUE CROSS/BLUE SHIELD | Admitting: Psychiatry

## 2014-06-18 DIAGNOSIS — F411 Generalized anxiety disorder: Secondary | ICD-10-CM

## 2014-06-18 DIAGNOSIS — E785 Hyperlipidemia, unspecified: Secondary | ICD-10-CM | POA: Diagnosis not present

## 2014-06-18 DIAGNOSIS — F331 Major depressive disorder, recurrent, moderate: Secondary | ICD-10-CM

## 2014-06-18 NOTE — Progress Notes (Signed)
    Daily Group Progress Note  Program: IOP  Group Time: 9:00-10:30  Participation Level: Active  Behavioral Response: Appropriate  Type of Therapy:  Group Therapy  Summary of Progress: Pt. Discussed history of perfectionism and pattern of self-critical thinking about self and work. Pt. Discussed managing stigma of depression diagnosis and learning self-acceptance.      Group Time: 10:30-12:00  Participation Level:  Active  Behavioral Response: Appropriate  Type of Therapy: Psycho-education Group  Summary of Progress: Pt. Watched and discussed Kristen Neff vHaze Rushingideo about developing self compassion.   Shaune PollackBrown, Alphonso Gregson B, LPC

## 2014-06-19 ENCOUNTER — Other Ambulatory Visit (HOSPITAL_COMMUNITY): Payer: BLUE CROSS/BLUE SHIELD | Admitting: Psychiatry

## 2014-06-19 DIAGNOSIS — F411 Generalized anxiety disorder: Secondary | ICD-10-CM

## 2014-06-19 DIAGNOSIS — F331 Major depressive disorder, recurrent, moderate: Secondary | ICD-10-CM

## 2014-06-20 ENCOUNTER — Other Ambulatory Visit (HOSPITAL_COMMUNITY): Payer: BLUE CROSS/BLUE SHIELD

## 2014-06-23 ENCOUNTER — Other Ambulatory Visit (HOSPITAL_COMMUNITY): Payer: BLUE CROSS/BLUE SHIELD | Admitting: Psychiatry

## 2014-06-23 DIAGNOSIS — F331 Major depressive disorder, recurrent, moderate: Secondary | ICD-10-CM

## 2014-06-23 DIAGNOSIS — F411 Generalized anxiety disorder: Secondary | ICD-10-CM

## 2014-06-23 NOTE — Progress Notes (Signed)
    Daily Group Progress Note  Program: IOP  Group Time: 9:00-10:30  Participation Level: Active  Behavioral Response: Appropriate  Type of Therapy:  Group Therapy  Summary of Progress: Pt. was active and is becoming more comfortable with the group. Pt. shared some of her experiences of being guilty of not being "a good mom". Pt. connected with group themes.       Group Time: 10:30-12:00  Participation Level:  Active  Behavioral Response: Appropriate  Type of Therapy: Psycho-education Group  Summary of Progress: Pt. was very active in group. Pt. watched Dewain PenningBrene Brown video on the power of vulnerability and completed a self-compassion exercise. Pt. was acceptable to the idea of words of affirmation. The group was able to share some ideas of possible words of affirmations and she stated that she would give it a try. Pt. admits to being hard on herself and saying harsh words.   Shaune PollackBrown, Jennifer B, LPC

## 2014-06-23 NOTE — Progress Notes (Signed)
    Daily Group Progress Note  Program: IOP  Group Time: 9:00-10:30  Participation Level: Active  Behavioral Response: Appropriate  Type of Therapy:  Psycho-education Group  Summary of Progress: Pt. Participated in medication management group facilitated by [facility].     Group Time: 10:30-12:00  Participation Level:  Active  Behavioral Response: Appropriate  Type of Therapy: Group Therapy  Summary of Progress: Pt. Reported that her mood has improved since being out of work. Pt. Recognizes co-worker as a significant trigger for her anxiety and wants to work on specific strategies for coping with relationship trauma related to co-worker.  Shaune PollackBrown, Conny Situ B, LPC

## 2014-06-24 ENCOUNTER — Other Ambulatory Visit (HOSPITAL_COMMUNITY): Payer: BLUE CROSS/BLUE SHIELD

## 2014-06-24 ENCOUNTER — Telehealth (HOSPITAL_COMMUNITY): Payer: Self-pay | Admitting: Psychiatry

## 2014-06-25 ENCOUNTER — Other Ambulatory Visit (HOSPITAL_COMMUNITY): Payer: BLUE CROSS/BLUE SHIELD | Admitting: Psychiatry

## 2014-06-25 DIAGNOSIS — F411 Generalized anxiety disorder: Secondary | ICD-10-CM

## 2014-06-25 DIAGNOSIS — F331 Major depressive disorder, recurrent, moderate: Secondary | ICD-10-CM | POA: Diagnosis not present

## 2014-06-26 ENCOUNTER — Other Ambulatory Visit (HOSPITAL_COMMUNITY): Payer: BLUE CROSS/BLUE SHIELD | Admitting: Psychiatry

## 2014-06-26 DIAGNOSIS — F331 Major depressive disorder, recurrent, moderate: Secondary | ICD-10-CM

## 2014-06-26 DIAGNOSIS — F411 Generalized anxiety disorder: Secondary | ICD-10-CM

## 2014-06-26 NOTE — Progress Notes (Signed)
    Daily Group Progress Note  Program: IOP  Group Time: 9:00-10:30  Participation Level: Active  Behavioral Response: Appropriate  Type of Therapy:  Group Therapy  Summary of Progress: Pt. Reports that she continues to be challenged with acceptance of her diagnosis, pattern of critical thinking towards herself, guilt related to stigma of mental illness, and pattens of perfectionism in home life and in professional relationships.     Group Time: 10:30-12:00  Participation Level:  Active  Behavioral Response: Appropriate  Type of Therapy: Psycho-education Group  Summary of Progress: Pt. was active in group. Pt. reported having trouble with self-acceptance and having difficulty processing her diagnosis. Pt. continues to have cognitive distortions and the all or nothing thinking. Pt. participated in a guided meditation on affirmation.  She was able to connect with the appropriate themes.   Shaune PollackBrown, Jennifer B, LPC

## 2014-06-26 NOTE — Progress Notes (Signed)
    Daily Group Progress Note  Program: IOP  Program: IOP   Group Time: 9:00-10:30   Participation Level: High   Behavioral Response: Appropriate   Type of Therapy:  Group Therapy  Summary of Progress: Pt. participates actively in group conversation and is empathetic with group members. Pt. would like an "immediate fix" for her depression and anxiety. Pt. is open to new hobbies and interests.         Group Time: 10:30-12:00   Participation Level:  High   Behavioral Response: Appropriate   Type of Therapy: Psycho-education Group   Summary of Progress: Pt. participated in group meditation exercise. Spoke positively about calming effects of the exercise.   Shaune PollackBrown, Schwanda Zima B, LPC

## 2014-06-27 ENCOUNTER — Other Ambulatory Visit (HOSPITAL_COMMUNITY): Payer: BLUE CROSS/BLUE SHIELD | Admitting: Psychiatry

## 2014-06-27 DIAGNOSIS — F331 Major depressive disorder, recurrent, moderate: Secondary | ICD-10-CM | POA: Diagnosis not present

## 2014-06-27 DIAGNOSIS — F411 Generalized anxiety disorder: Secondary | ICD-10-CM

## 2014-06-27 NOTE — Progress Notes (Signed)
    Daily Group Progress Note  Program: IOP  Group Time: 9:00-10:30  Participation Level: Active  Behavioral Response: Appropriate  Type of Therapy:  Group Therapy  Summary of Progress: Pt. Reported that she was feeling "ok". Pt. Discussed that she wrote 6 pages in her journal and that it was helpful for her to process her thoughts and feelings.      Group Time: 10:30-12:00  Participation Level:  Active  Behavioral Response: Appropriate  Type of Therapy: Psycho-education Group  Summary of Progress: Pt. Participated in group facilitated by Theda BelfastBob Hamilton.   Shaune PollackBrown, Jennifer B, LPC

## 2014-06-30 ENCOUNTER — Other Ambulatory Visit (HOSPITAL_COMMUNITY): Payer: BLUE CROSS/BLUE SHIELD | Attending: Psychiatry | Admitting: Psychiatry

## 2014-06-30 DIAGNOSIS — F331 Major depressive disorder, recurrent, moderate: Secondary | ICD-10-CM

## 2014-06-30 DIAGNOSIS — F411 Generalized anxiety disorder: Secondary | ICD-10-CM

## 2014-06-30 NOTE — Progress Notes (Signed)
    Daily Group Progress Note  Program: IOP  Group Time: 9:00-10:00  Participation Level: Active  Behavioral Response: Appropriate  Type of Therapy:  Psycho-education Group  Summary of Progress: Pt. Participated in medication management group with Michelle NasutiElena.     Group Time: 10:00-12:00  Participation Level:  Active  Behavioral Response: Appropriate  Type of Therapy: Group Therapy  Summary of Progress: Pt. Presented with bright affect, talked and laughed appropriately. Pt. Reported that mood was good most of the weekend but began to feel down on Saturday afternoon. Pt. Discussed desire to disclose to co-workers reason for her absence from work.  Shaune PollackBrown, Luan Urbani B, LPC

## 2014-07-01 ENCOUNTER — Other Ambulatory Visit (HOSPITAL_COMMUNITY): Payer: BLUE CROSS/BLUE SHIELD | Admitting: Psychiatry

## 2014-07-01 DIAGNOSIS — F331 Major depressive disorder, recurrent, moderate: Secondary | ICD-10-CM | POA: Diagnosis not present

## 2014-07-02 ENCOUNTER — Other Ambulatory Visit (HOSPITAL_COMMUNITY): Payer: BLUE CROSS/BLUE SHIELD | Admitting: Psychiatry

## 2014-07-02 NOTE — Progress Notes (Signed)
    Daily Group Progress Note  Program: IOP  Group Time: 9:00-10:30  Participation Level: Active  Behavioral Response: Appropriate  Type of Therapy:  Group Therapy  Summary of Progress: Pt. reports having a migraine last night and made a connection with her mood. Pt. reports to be in a "blah" mood today.     Group Time: 10:30-12:00  Participation Level:  Active  Behavioral Response: Appropriate  Type of Therapy: Psycho-education Group  Summary of Progress: Pt. learned about the dangers of comparison and ways to boost self-confidence. Pt. was able to talk about some of the positive traits she identifies with. Pt. continues to talk about the fear of going back to work and fearing that others will judge her for being away.  Shaune PollackBrown, Jennifer B, LPC

## 2014-07-03 ENCOUNTER — Other Ambulatory Visit (HOSPITAL_COMMUNITY): Payer: BLUE CROSS/BLUE SHIELD | Admitting: Psychiatry

## 2014-07-03 DIAGNOSIS — F331 Major depressive disorder, recurrent, moderate: Secondary | ICD-10-CM | POA: Diagnosis not present

## 2014-07-03 DIAGNOSIS — F411 Generalized anxiety disorder: Secondary | ICD-10-CM

## 2014-07-03 NOTE — Progress Notes (Signed)
  [facility] Summary  Susan Thompson [id number]  Admission date: 06/17/2014 Discharge date: 07/07/2014  Reason for admission  depression  Chemical Use History: none  Family of Origin Issues: none of current relevance  Progress in Program Toward Treatment Goals: believes she has gotten a lot of help with coping skills that she can put to use, mainly how to take things one at a time and not overwhelm herself with all the possibilities  Progress (rationale): benefited from being with others with similar problems and being trained in coping skills    Benjaman PottAYLOR,GERALD D, MD 2/4/20161/19/2016

## 2014-07-03 NOTE — Progress Notes (Signed)
    Daily Group Progress Note  Program: IOP  Group Time: 9:00-10:30  Participation Level: Active  Behavioral Response: Appropriate  Type of Therapy:  Group Therapy  Summary of Progress: Pt. Discussed relationship with father and belief that he was critical of her and loved her and how relationship has carried into relationships with others she perceives as authority figures.      Group Time: 10:30-12:00  Participation Level:  Active  Behavioral Response: Appropriate  Type of Therapy: Psycho-education Group  Summary of Progress: Pt. Participated in journaling exercise "If I believed I was enough..."  Shaune PollackBrown, Mieko Kneebone B, LPC

## 2014-07-03 NOTE — Progress Notes (Signed)
    Daily Group Progress Note  Program: IOP  Group Time: 9:00-10:30  Participation Level: Minimal  Behavioral Response: Appropriate  Type of Therapy:  Group Therapy  Summary of Progress: Pt. Discussed relationship with father and how it continues to affect adult relationships Pt. Perceives as authority figures.      Group Time: 10:30-12:00  Participation Level:  Active  Behavioral Response: Appropriate  Type of Therapy: Psycho-education Group  Summary of Progress: Pt. Participated in journaling exercise "If i believed i was enough..."  Shaune PollackBrown, Amorina Doerr B, LPC

## 2014-07-04 ENCOUNTER — Other Ambulatory Visit (HOSPITAL_COMMUNITY): Payer: BLUE CROSS/BLUE SHIELD | Admitting: Psychiatry

## 2014-07-04 DIAGNOSIS — F411 Generalized anxiety disorder: Secondary | ICD-10-CM

## 2014-07-04 DIAGNOSIS — F331 Major depressive disorder, recurrent, moderate: Secondary | ICD-10-CM | POA: Diagnosis not present

## 2014-07-04 NOTE — Progress Notes (Signed)
    Daily Group Progress Note  Program: IOP  Group Time: 9:00-10:30  Participation Level: Active  Behavioral Response: Appropriate  Type of Therapy:  Group Therapy  Summary of Progress: Pt. Presented with brightened affect. Pt. Discussed process of acceptance of mental health diagnosis and working through belief that she did not need medication. Pt. Reported looking forward to the weekend and enjoying the superbowl.      Group Time: 10:30-12:00  Participation Level:  Active  Behavioral Response: Appropriate  Type of Therapy: Psycho-education Group  Summary of Progress: Pt. Was alert and attentive during grief and loss group.   Shaune PollackBrown, Jennifer B, LPC

## 2014-07-04 NOTE — Patient Instructions (Addendum)
Patient will complete MH-IOP on 07-07-14.  Will follow up with Jake SeatsLisa Poulus, NP on 07-08-14 @ 4:15 pm and Phylliss BlakesGwen Auel, LCSW on 07-22-14 @ 4 pm.  Encouraged support groups.  Return to work on 07-14-14, without any restrictions.

## 2014-07-07 ENCOUNTER — Other Ambulatory Visit (HOSPITAL_COMMUNITY): Payer: BLUE CROSS/BLUE SHIELD | Admitting: Psychiatry

## 2014-07-07 DIAGNOSIS — F331 Major depressive disorder, recurrent, moderate: Secondary | ICD-10-CM | POA: Diagnosis not present

## 2014-07-07 DIAGNOSIS — F411 Generalized anxiety disorder: Secondary | ICD-10-CM

## 2014-07-07 NOTE — Progress Notes (Signed)
Susan Thompson is a 49 y.o. , married, employed, Caucasian female; who was referred per Jake SeatsLisa Poulus, NP and Phylliss BlakesGwen Auel, LCSW; treatment for ongoing depressive and anxiety symptoms. Pt stated her symptoms worsened in September 2015. Symptoms included: Tearfulness, ruminating thoughts, isolation, sadness, decreased self-esteem, poor appetite, lack of motivation, anxiety, anhedonia, irritability and feelings of hopelessness, helplessness and worthlessness. Denied SI/Hi or A/V hallucinations. Triggers/Stressors: 1) Medical Issues: PCOS. States her hair falling out. 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 Stated she loves her job. Pt was fired from [employer] in 2007 due to absences. Denies any previous psychiatric admissions or suicide attempts. Has been seeing Jake SeatsLisa Poulus, NP and Phylliss BlakesGwen Auel, LCSW since November 2015. Family Hx: Father (Depression) Pt completed MH-IOP today.  Reports improved sleep (~ 6 hrs).  Continues to struggle with emotional eating, low energy, lack of motivation, anhedonia and anxiety.  Pt is anxious about returning to work.  Denies SI/HI or A/V hallucinations.  A:  D/C today.  F/U with Jake SeatsLisa Poulus, NP on 07-08-14 @ 4:15 pm and Phylliss BlakesGwen Auel, LCSW on 07-22-14 @ 4pm.  Encouraged support groups.  RTW on 07-14-14 without any restrictions.  R:  Pt receptive.

## 2014-07-08 ENCOUNTER — Other Ambulatory Visit (HOSPITAL_COMMUNITY): Payer: BLUE CROSS/BLUE SHIELD

## 2014-07-09 ENCOUNTER — Other Ambulatory Visit (HOSPITAL_COMMUNITY): Payer: BLUE CROSS/BLUE SHIELD

## 2014-07-09 NOTE — Progress Notes (Signed)
    Daily Group Progress Note  Program: IOP  Group Time: 9:00-10:30  Participation Level: Active  Behavioral Response: Appropriate  Type of Therapy:  Group Therapy  Summary of Progress: Pt. was active in group today. Pt. learned about various mental health medications with the pharmacist HusliaElena.      Group Time: 10:30-12:00  Participation Level:  Active  Behavioral Response: Appropriate  Type of Therapy: Psycho-education Group  Summary of Progress: Pt. was active in group. Pt. learned about symptoms of panic attacks and what causes them to occur, treatment options, and discussed how family members and coworkers unintentionally say negative comments about her depression and anxiety. Pt. watched a video on what a panic attack can look like, but reports of never having one. Pt. was a little nervous to go back to work and face her coworkers. She admits to having learned a lot in group and plans on using the techniques that she was taught when difficult situations arise.   Shaune PollackBrown, Jaythan Hinely B, LPC

## 2014-07-10 ENCOUNTER — Other Ambulatory Visit (HOSPITAL_COMMUNITY): Payer: BLUE CROSS/BLUE SHIELD

## 2014-07-11 ENCOUNTER — Other Ambulatory Visit (HOSPITAL_COMMUNITY): Payer: BLUE CROSS/BLUE SHIELD

## 2014-07-14 ENCOUNTER — Other Ambulatory Visit (HOSPITAL_COMMUNITY): Payer: BLUE CROSS/BLUE SHIELD

## 2014-07-15 ENCOUNTER — Other Ambulatory Visit (HOSPITAL_COMMUNITY): Payer: BLUE CROSS/BLUE SHIELD

## 2014-07-16 ENCOUNTER — Other Ambulatory Visit (HOSPITAL_COMMUNITY): Payer: BLUE CROSS/BLUE SHIELD

## 2014-07-17 ENCOUNTER — Other Ambulatory Visit (HOSPITAL_COMMUNITY): Payer: BLUE CROSS/BLUE SHIELD

## 2014-07-18 ENCOUNTER — Other Ambulatory Visit (HOSPITAL_COMMUNITY): Payer: BLUE CROSS/BLUE SHIELD

## 2014-07-21 ENCOUNTER — Other Ambulatory Visit (HOSPITAL_COMMUNITY): Payer: BLUE CROSS/BLUE SHIELD

## 2014-07-22 ENCOUNTER — Other Ambulatory Visit (HOSPITAL_COMMUNITY): Payer: BLUE CROSS/BLUE SHIELD

## 2014-07-23 ENCOUNTER — Other Ambulatory Visit (HOSPITAL_COMMUNITY): Payer: BLUE CROSS/BLUE SHIELD

## 2014-07-24 ENCOUNTER — Other Ambulatory Visit (HOSPITAL_COMMUNITY): Payer: BLUE CROSS/BLUE SHIELD

## 2014-07-25 ENCOUNTER — Other Ambulatory Visit (HOSPITAL_COMMUNITY): Payer: BLUE CROSS/BLUE SHIELD

## 2014-07-28 ENCOUNTER — Other Ambulatory Visit (HOSPITAL_COMMUNITY): Payer: BLUE CROSS/BLUE SHIELD

## 2014-09-23 ENCOUNTER — Ambulatory Visit (INDEPENDENT_AMBULATORY_CARE_PROVIDER_SITE_OTHER): Payer: BLUE CROSS/BLUE SHIELD | Admitting: Emergency Medicine

## 2014-09-23 VITALS — BP 118/70 | HR 94 | Temp 98.0°F | Resp 20 | Ht 64.0 in | Wt 206.6 lb

## 2014-09-23 DIAGNOSIS — L503 Dermatographic urticaria: Secondary | ICD-10-CM | POA: Diagnosis not present

## 2014-09-23 MED ORDER — HYDROXYZINE HCL 50 MG PO TABS: 25.0000 mg | ORAL_TABLET | Freq: Every day | ORAL | Status: DC

## 2014-09-23 MED ORDER — FEXOFENADINE HCL 180 MG PO TABS: 180.0000 mg | ORAL_TABLET | Freq: Two times a day (BID) | ORAL | Status: DC

## 2014-09-23 MED ORDER — RANITIDINE HCL 150 MG PO TABS: 150.0000 mg | ORAL_TABLET | Freq: Two times a day (BID) | ORAL | Status: DC

## 2014-09-23 NOTE — Patient Instructions (Signed)
Hives Hives are itchy, red, swollen areas of the skin. They can vary in size and location on your body. Hives can come and go for hours or several days (acute hives) or for several weeks (chronic hives). Hives do not spread from person to person (noncontagious). They may get worse with scratching, exercise, and emotional stress. CAUSES   Allergic reaction to food, additives, or drugs.  Infections, including the common cold.  Illness, such as vasculitis, lupus, or thyroid disease.  Exposure to sunlight, heat, or cold.  Exercise.  Stress.  Contact with chemicals. SYMPTOMS   Red or white swollen patches on the skin. The patches may change size, shape, and location quickly and repeatedly.  Itching.  Swelling of the hands, feet, and face. This may occur if hives develop deeper in the skin. DIAGNOSIS  Your caregiver can usually tell what is wrong by performing a physical exam. Skin or blood tests may also be done to determine the cause of your hives. In some cases, the cause cannot be determined. TREATMENT  Mild cases usually get better with medicines such as antihistamines. Severe cases may require an emergency epinephrine injection. If the cause of your hives is known, treatment includes avoiding that trigger.  HOME CARE INSTRUCTIONS   Avoid causes that trigger your hives.  Take antihistamines as directed by your caregiver to reduce the severity of your hives. Non-sedating or low-sedating antihistamines are usually recommended. Do not drive while taking an antihistamine.  Take any other medicines prescribed for itching as directed by your caregiver.  Wear loose-fitting clothing.  Keep all follow-up appointments as directed by your caregiver. SEEK MEDICAL CARE IF:   You have persistent or severe itching that is not relieved with medicine.  You have painful or swollen joints. SEEK IMMEDIATE MEDICAL CARE IF:   You have a fever.  Your tongue or lips are swollen.  You have  trouble breathing or swallowing.  You feel tightness in the throat or chest.  You have abdominal pain. These problems may be the first sign of a life-threatening allergic reaction. Call your local emergency services (911 in [place] MAKE SURE YOU:   Understand these instructions.  Will watch your condition.  Will get help right away if you are not doing well or get worse. Document Released: 05/16/2005 Document Revised: 05/21/2013 Document Reviewed: 08/09/2011 ExitCare Patient Information 2015 [employer]. This information is not intended to replace advice given to you by your health care provider. Make sure you discuss any questions you have with your health care provider.  

## 2014-09-23 NOTE — Progress Notes (Signed)
[facility] [address] [place] [address] [phone]  Date:  09/23/2014   Name:  Susan Thompson   DOB:  1965/12/03   MRN:  [id number]  PCP:  Jeani Hawking, MD    Chief Complaint: Pruritis   History of Present Illness:  Susan Thompson is a 49 y.o. 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 No new allergen or exposure no new personal care products No improvement with over the counter medications or other home remedies.  Denies other complaint or health concern today.   Patient Active Problem List   Diagnosis Date Noted  . Depression, major, recurrent, moderate 06/17/2014  . Anal fissure 05/31/2012    Past Medical History  Diagnosis Date  . Hemorrhoids   . Arthritis   . Hyperlipidemia   . Rectal pain   . History of migraine   . Rectal bleeding   . Anxiety   . Depression     Past Surgical History  Procedure Laterality Date  . Appendectomy  1982  . Cholecystectomy  1997  . Laparoscopic gastric banding  2005  . Tonsillectomy and adenoidectomy  1999    History  Substance Use Topics  . Smoking status: Never Smoker   . Smokeless tobacco: Not on file  . Alcohol Use: No     Comment: rarely    Family History  Problem Relation Age of Onset  . Depression Father     Allergies  Allergen Reactions  . Flagyl [Metronidazole Hcl] Hives    Hives will spread all over the body. Had to use epi pen to resolve because the hives were so bad. Breathing not affected.    Medication list has been reviewed and updated.  Current Outpatient Prescriptions on File Prior to Visit  Medication Sig Dispense Refill  . buPROPion (WELLBUTRIN SR) 150 MG 12 hr tablet Take 150 mg by mouth 2 (two) times daily.    Marland Kitchen EPINEPHrine (EPI-PEN) 0.3 mg/0.3 mL DEVI Inject 0.3 mLs (0.3 mg total)  into the muscle once. 1 Device 0  . metFORMIN (GLUCOPHAGE) 1000 MG tablet Take 1,000 mg by mouth 2 (two) times daily with a meal.    . polyethylene glycol powder (GLYCOLAX/MIRALAX) powder TAKE 17 GRAMS BY MOUTH DAILY TO CORRECT CONSTIPATION 255 g 3  . venlafaxine (EFFEXOR-XR) 150 MG 24 hr capsule Take 150 mg by mouth 2 (two) times daily.     . [DISCONTINUED] norgestrel-ethinyl estradiol (CRYSELLE-28) 0.3-30 MG-MCG tablet Take 1 tablet by mouth daily.     No current facility-administered medications on file prior to visit.    Review of Systems:  As per HPI, otherwise negative.    Physical Examination: Filed Vitals:   09/23/14 0943  BP: 118/70  Pulse: 94  Temp: 98 F (36.7 C)  Resp: 20   Filed Vitals:   09/23/14 0943  Height:  (1.626 m)  Weight: 206 lb 9.6 oz (93.713 kg)   Body mass index is 35.45 kg/(m^2). Ideal Body Weight: Weight in (lb) to have BMI = 25: 145.3   GEN: WDWN, NAD, Non-toxic, Alert & Oriented x 3 HEENT: Atraumatic, Normocephalic.  Ears and Nose: No external deformity. EXTR: No clubbing/cyanosis/edema NEURO: Normal gait.  PSYCH: Normally interactive. Conversant. Not depressed or anxious appearing.  Calm demeanor.  SKIN:  Erythema thighs.  Excoriations lowers and feet. Dermatographism on  arms  Assessment and Plan: Dermatographism Zantac [facility]  Signed,  Phillips OdorJeffery Analleli Gierke, MD

## 2014-11-12 ENCOUNTER — Other Ambulatory Visit: Payer: Self-pay

## 2014-11-12 MED ORDER — RANITIDINE HCL 150 MG PO TABS: 150.0000 mg | ORAL_TABLET | Freq: Two times a day (BID) | ORAL | Status: DC

## 2015-01-07 ENCOUNTER — Other Ambulatory Visit: Payer: Self-pay | Admitting: Obstetrics and Gynecology

## 2015-01-07 DIAGNOSIS — R928 Other abnormal and inconclusive findings on diagnostic imaging of breast: Secondary | ICD-10-CM

## 2015-01-09 ENCOUNTER — Ambulatory Visit
Admission: RE | Admit: 2015-01-09 | Discharge: 2015-01-09 | Disposition: A | Discharge status: Home / Self Care | Payer: BLUE CROSS/BLUE SHIELD | Source: Ambulatory Visit | Attending: Obstetrics and Gynecology | Admitting: Obstetrics and Gynecology

## 2015-01-09 DIAGNOSIS — R928 Other abnormal and inconclusive findings on diagnostic imaging of breast: Secondary | ICD-10-CM

## 2016-04-30 IMAGING — MG MM DIAG BREAST TOMO UNI LEFT
6 series · 6 of 18 positions shown · non-contrast
Comparison: Previous exam(s).

CLINICAL DATA: Screening recall for an asymmetry seen in the left
breast on the MLO view only.

EXAM:
DIGITAL DIAGNOSTIC LEFT MAMMOGRAM WITH 3D TOMOSYNTHESIS AND CAD

[L MLO]
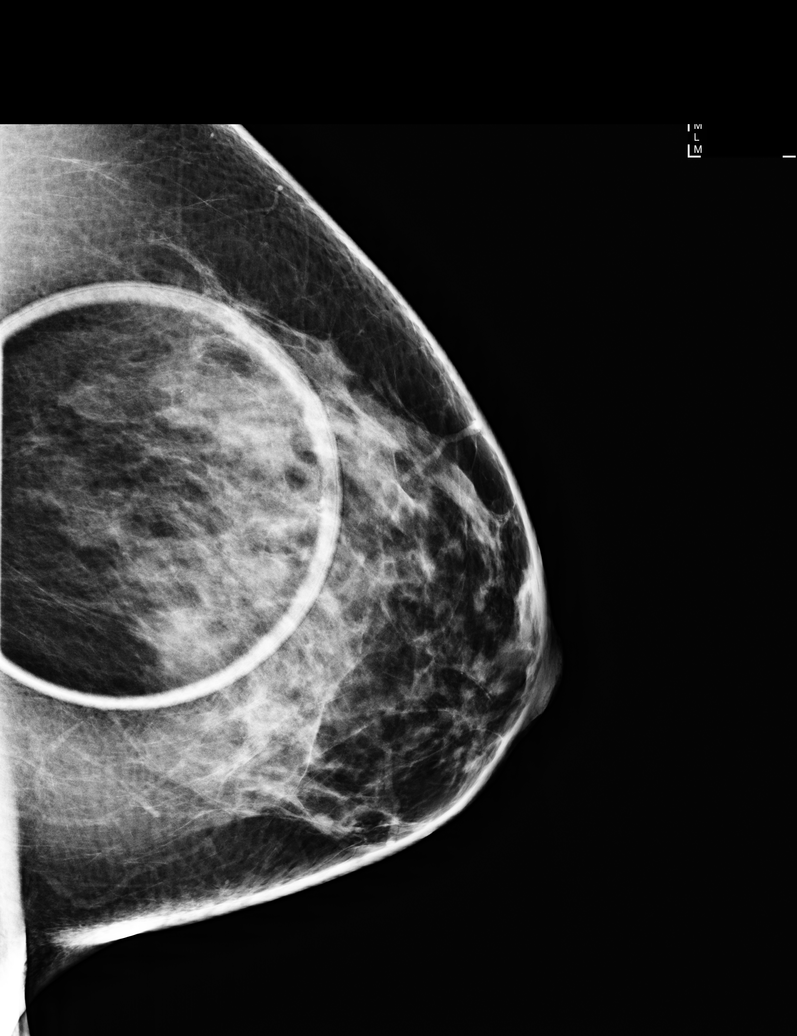

[L ML]
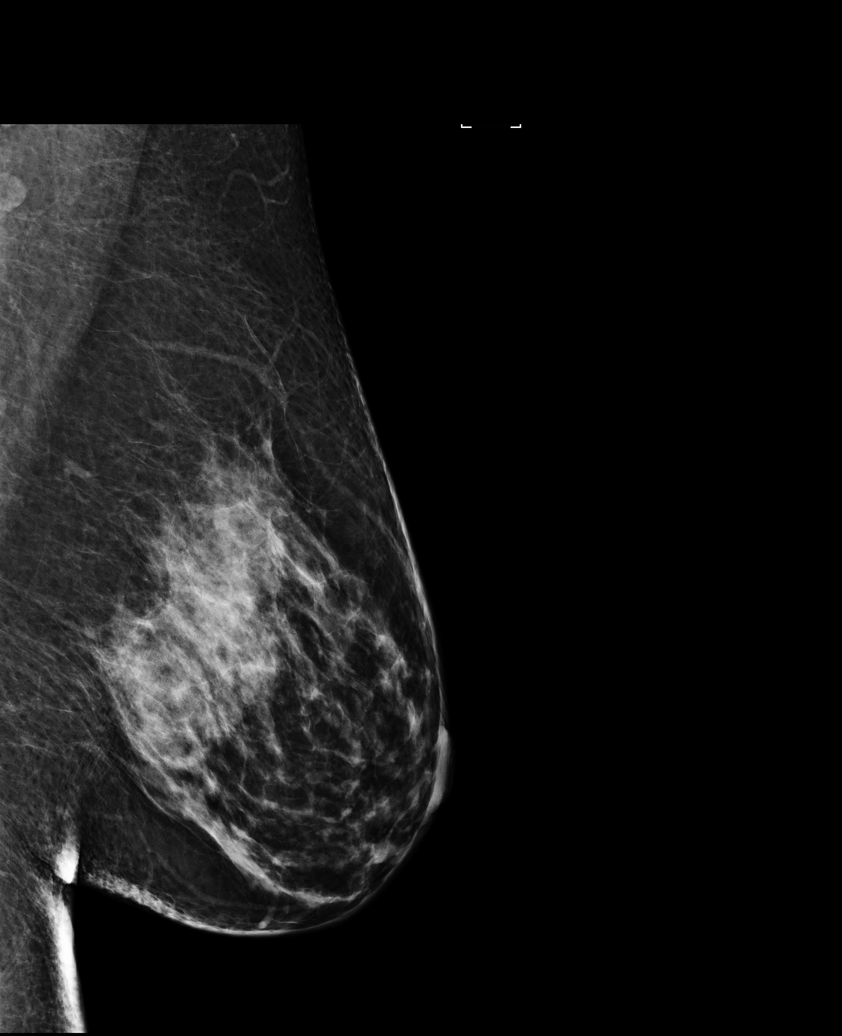

[L XCCL]
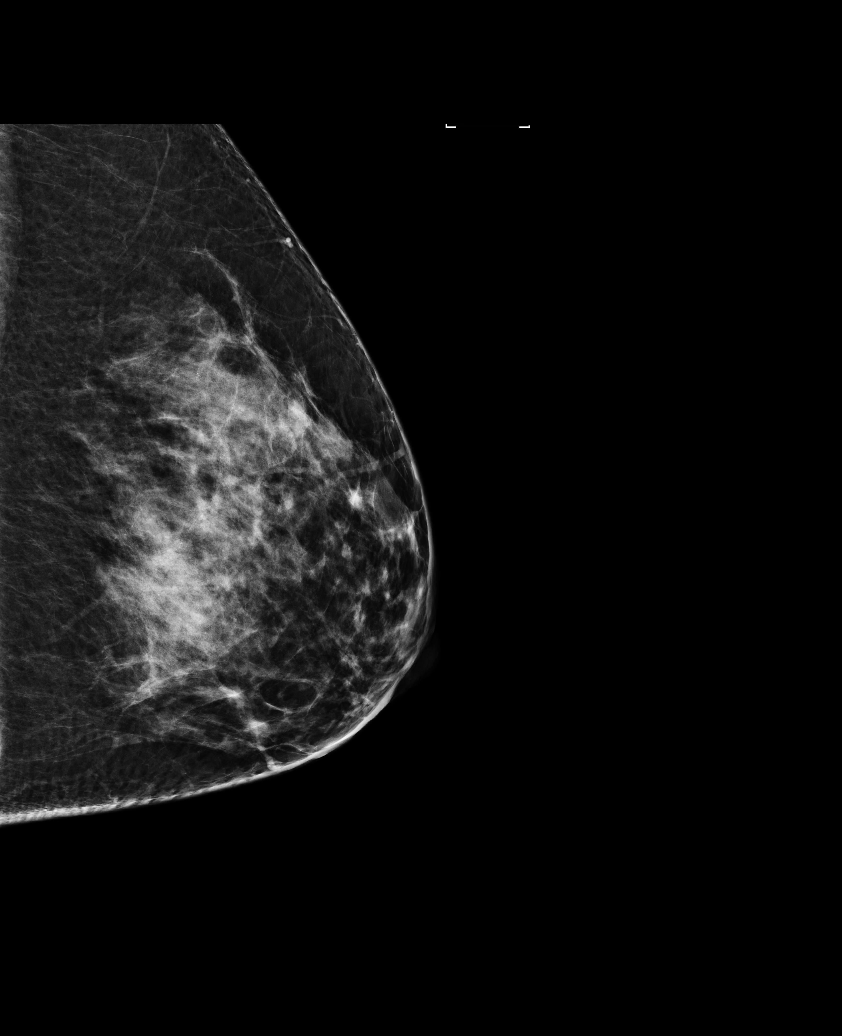

[L XCCL tomo · tomo slice 41/80.0]
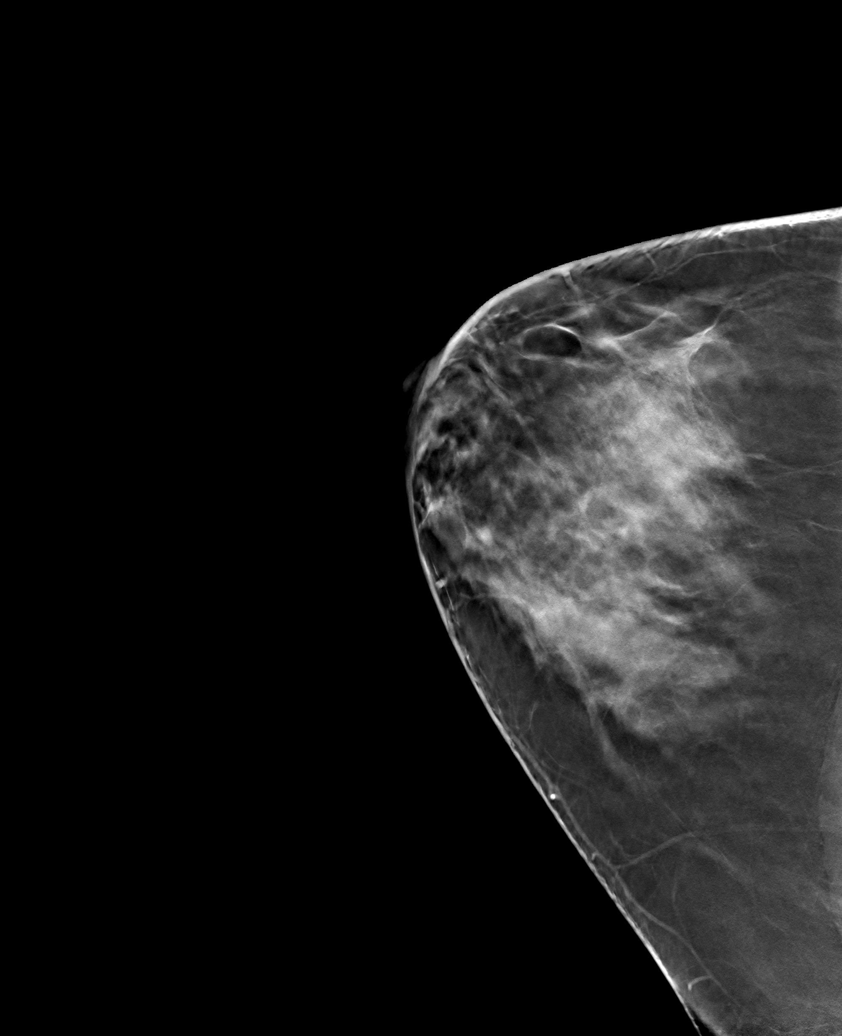

[L MLO tomo · tomo slice 38/75.0]
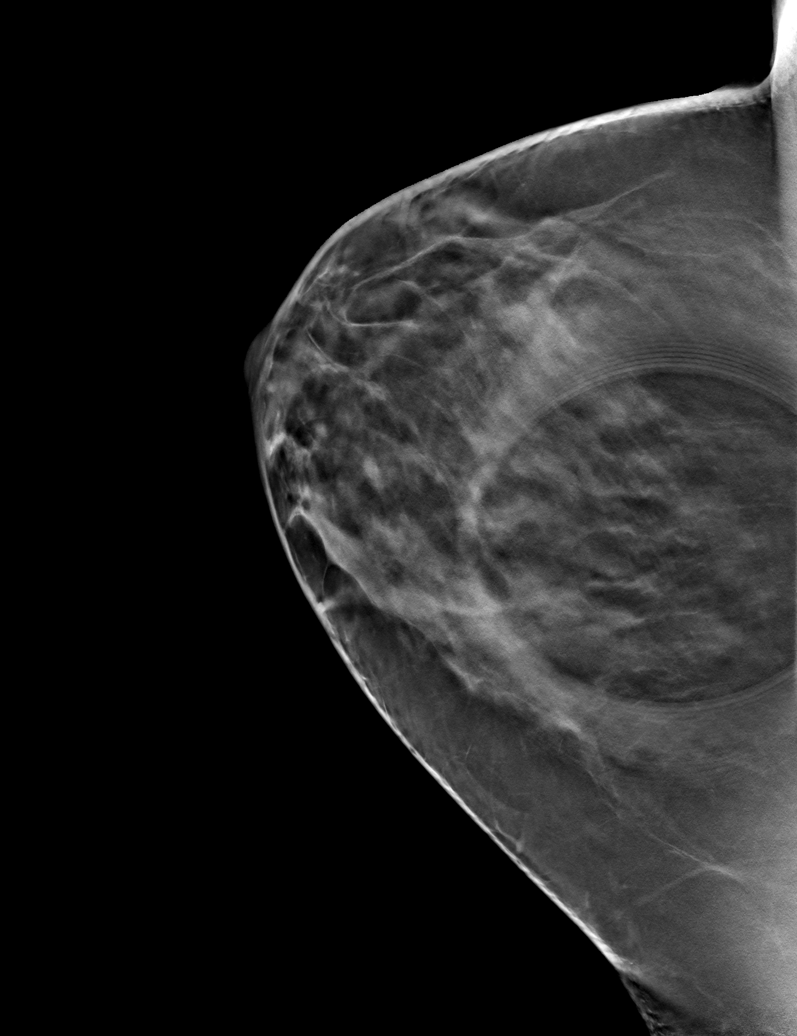

[L ML tomo · tomo slice 49/97.0]
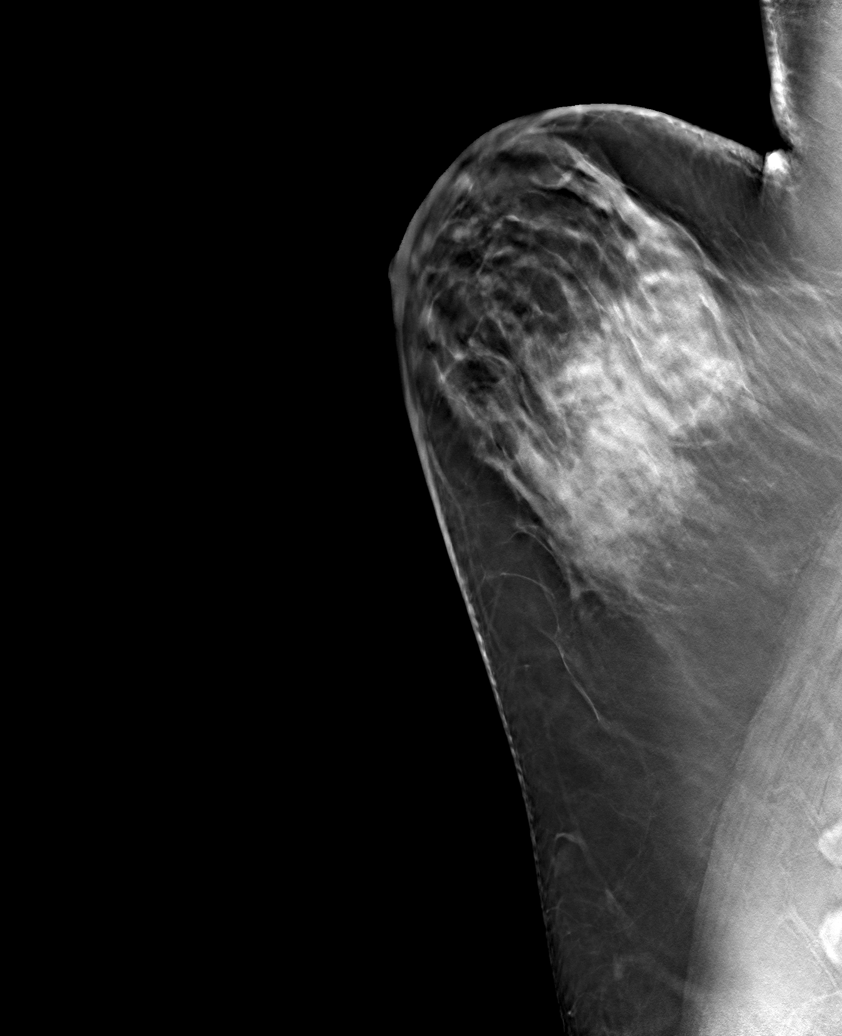

[6 of 18 positions shown; findings below may reference images not displayed]

ACR Breast Density Category c: The breast tissue is heterogeneously
dense, which may obscure small masses.
FINDINGS: ML and XCCL tomosynthesis as well as spot compression tomosynthesis
was performed of the left breast. The initially questioned left
breast asymmetry resolves on the additional imaging with findings
compatible with overlapping tissue. There is no mammographic
evidence of malignancy in the left breast.

Mammographic images were processed with CAD.
IMPRESSION: Initially questioned left breast asymmetry resolves on the
additional imaging compatible with overlapping tissue. There is no
mammographic evidence of malignancy in the left breast.

RECOMMENDATION:
Screening mammogram in one year.(Code:Z6-E-GP6)

I have discussed the findings and recommendations with the patient.
Results were also provided in writing at the conclusion of the
visit. If applicable, a reminder letter will be sent to the patient
regarding the next appointment.

BI-RADS CATEGORY  1: Negative.

## 2016-07-08 ENCOUNTER — Other Ambulatory Visit: Payer: Self-pay | Admitting: Obstetrics and Gynecology

## 2016-07-08 DIAGNOSIS — R928 Other abnormal and inconclusive findings on diagnostic imaging of breast: Secondary | ICD-10-CM

## 2016-07-13 ENCOUNTER — Ambulatory Visit
Admission: RE | Admit: 2016-07-13 | Discharge: 2016-07-13 | Disposition: A | Discharge status: Home / Self Care | Payer: BLUE CROSS/BLUE SHIELD | Source: Ambulatory Visit | Attending: Obstetrics and Gynecology | Admitting: Obstetrics and Gynecology

## 2016-07-13 DIAGNOSIS — R928 Other abnormal and inconclusive findings on diagnostic imaging of breast: Secondary | ICD-10-CM

## 2016-12-08 ENCOUNTER — Other Ambulatory Visit: Payer: Self-pay | Admitting: Obstetrics and Gynecology

## 2016-12-08 DIAGNOSIS — N6002 Solitary cyst of left breast: Secondary | ICD-10-CM

## 2017-01-11 ENCOUNTER — Other Ambulatory Visit: Payer: Self-pay

## 2017-02-08 ENCOUNTER — Other Ambulatory Visit: Payer: Self-pay

## 2017-07-11 ENCOUNTER — Other Ambulatory Visit (HOSPITAL_COMMUNITY): Payer: Self-pay

## 2017-07-12 ENCOUNTER — Encounter (HOSPITAL_COMMUNITY)
Admission: RE | Admit: 2017-07-12 | Discharge: 2017-07-12 | Disposition: A | Discharge status: Home / Self Care | Payer: No Typology Code available for payment source | Source: Ambulatory Visit | Attending: Obstetrics and Gynecology | Admitting: Obstetrics and Gynecology

## 2017-07-12 DIAGNOSIS — D649 Anemia, unspecified: Secondary | ICD-10-CM | POA: Insufficient documentation

## 2017-07-12 MED ORDER — SODIUM CHLORIDE 0.9 % IV SOLN
510.0000 mg | INTRAVENOUS | Status: DC
  Administered 2017-07-12: 510 mg via INTRAVENOUS
  Filled 2017-07-12: qty 17

## 2017-07-17 ENCOUNTER — Encounter (HOSPITAL_COMMUNITY): Payer: Self-pay

## 2017-07-19 ENCOUNTER — Inpatient Hospital Stay (HOSPITAL_COMMUNITY): Admission: RE | Admit: 2017-07-19 | Payer: Self-pay | Source: Ambulatory Visit

## 2018-01-03 ENCOUNTER — Other Ambulatory Visit: Payer: Self-pay | Admitting: Obstetrics and Gynecology

## 2018-01-03 DIAGNOSIS — Z09 Encounter for follow-up examination after completed treatment for conditions other than malignant neoplasm: Secondary | ICD-10-CM

## 2018-01-03 DIAGNOSIS — N6320 Unspecified lump in the left breast, unspecified quadrant: Secondary | ICD-10-CM

## 2018-01-10 ENCOUNTER — Ambulatory Visit
Admission: RE | Admit: 2018-01-10 | Discharge: 2018-01-10 | Disposition: A | Discharge status: Home / Self Care | Payer: No Typology Code available for payment source | Source: Ambulatory Visit | Attending: Obstetrics and Gynecology | Admitting: Obstetrics and Gynecology

## 2018-01-10 DIAGNOSIS — Z09 Encounter for follow-up examination after completed treatment for conditions other than malignant neoplasm: Secondary | ICD-10-CM

## 2018-01-10 DIAGNOSIS — N6320 Unspecified lump in the left breast, unspecified quadrant: Secondary | ICD-10-CM

## 2019-01-30 ENCOUNTER — Encounter: Payer: Self-pay | Admitting: Gastroenterology

## 2019-01-30 ENCOUNTER — Ambulatory Visit (INDEPENDENT_AMBULATORY_CARE_PROVIDER_SITE_OTHER): Payer: No Typology Code available for payment source | Admitting: Gastroenterology

## 2019-01-30 ENCOUNTER — Other Ambulatory Visit: Payer: Self-pay

## 2019-01-30 VITALS — BP 118/68 | HR 72 | Temp 97.8°F | Ht 64.5 in | Wt 141.0 lb

## 2019-01-30 DIAGNOSIS — K5900 Constipation, unspecified: Secondary | ICD-10-CM | POA: Diagnosis not present

## 2019-01-30 NOTE — Progress Notes (Signed)
HPI: This is a very pleasant 53 year old woman who was referred to me by Dian Queen, MD  to evaluate elevated risk for colon cancer.    Chief complaint is possible elevated risk for colon cancer  She has chronic intermittent constipation alternating with diarrhea.  She has started a regimen of once daily MiraLAX and on this regimen she is very happy with her bowels.  She does not struggle with constipation as long she takes the MiraLAX.  She does have intermittent hemorrhoids that can bleed periodically.  Her mother and father have both had colon polyps.  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  I do not have the formal genetic testing results at the time of this visit.      Review of systems: Pertinent positive and negative review of systems were noted in the above HPI section. All other review negative.   Past Medical History:  Diagnosis Date  . Anxiety   . Arthritis   . Depression   . Hemorrhoids   . History of migraine   . Hyperlipidemia   . Rectal bleeding   . Rectal pain     Past Surgical History:  Procedure Laterality Date  . APPENDECTOMY  1982  . CHOLECYSTECTOMY  1997  . LAPAROSCOPIC GASTRIC BANDING  2005  . TONSILLECTOMY AND ADENOIDECTOMY  1999    Current Outpatient Medications  Medication Sig Dispense Refill  . buPROPion (WELLBUTRIN SR) 150 MG 12 hr tablet Take 150 mg by mouth 2 (two) times daily.    . hydroxychloroquine (PLAQUENIL) 200 MG tablet Take 200 mg by mouth daily.    . polyethylene glycol powder (GLYCOLAX/MIRALAX) powder TAKE 17 GRAMS BY MOUTH DAILY TO CORRECT CONSTIPATION 255 g 3  . progesterone (PROMETRIUM) 200 MG capsule Take 200 mg by mouth daily.    . propranolol (INDERAL) 10 MG tablet Take 10 mg by mouth 2 (two) times daily.    Marland Kitchen spironolactone (ALDACTONE) 100 MG tablet Take 100 mg by  mouth daily.     No current facility-administered medications for this visit.     Allergies as of 01/30/2019 - Review Complete 01/30/2019  Allergen Reaction Noted  . Flagyl [metronidazole hcl] Hives 08/17/2011    Family History  Problem Relation Age of Onset  . Colon polyps Mother   . Depression Father   . Colon polyps Father   . Breast cancer Maternal Aunt 86    Social History   Socioeconomic History  . Marital status: Married    Spouse name: Not on file  . Number of children: Not on file  . Years of education: Not on file  . Highest education level: Not on file  Occupational History  . Not on file  Social Needs  . Financial resource strain: Not on file  . Food insecurity    Worry: Not on file    Inability: Not on file  . Transportation needs    Medical: Not on file    Non-medical: Not on file  Tobacco Use  . Smoking status: Never Smoker  Substance and Sexual Activity  . Alcohol use: No    Comment: rarely  . Drug use: No  . Sexual activity: Not Currently  Lifestyle  . Physical activity    Days per week: Not on file    Minutes per session: Not  on file  . Stress: Not on file  Relationships  . Social Musicianconnections    Talks on phone: Not on file    Gets together: Not on file    Attends religious service: Not on file    Active member of club or organization: Not on file    Attends meetings of clubs or organizations: Not on file    Relationship status: Not on file  . Intimate partner violence    Fear of current or ex partner: Not on file    Emotionally abused: Not on file    Physically abused: Not on file    Forced sexual activity: Not on file  Other Topics Concern  . Not on file  Social History Narrative  . Not on file     Physical Exam: BP 118/68   Pulse 72   Temp 97.8 F (36.6 C) (Temporal)   Ht 5' 4.5" (1.638 m)   Wt 141 lb (64 kg)   BMI 23.83 kg/m  Constitutional: generally well-appearing Psychiatric: alert and oriented x3 Eyes: extraocular  movements intact Mouth: oral pharynx moist, no lesions Neck: supple no lymphadenopathy Cardiovascular: heart regular rate and rhythm Lungs: clear to auscultation bilaterally Abdomen: soft, nontender, nondistended, no obvious ascites, no peritoneal signs, normal bowel sounds Extremities: no lower extremity edema bilaterally Skin: no lesions on visible extremities   Assessment and plan: 53 y.o. female with possible elevated risk for colon cancer, MUTYH mutation  I am interested to see the actual genetic test results with regards to this mutation.  We will request those from her referring physician.  Regardless she certainly needs a colonoscopy, she is [age] and she has not had colon cancer screening.  I see no reason for any further blood tests or imaging studies prior to then.  She knows she can stay on MiraLAX indefinitely and should do so because it helps her constipation and hemorrhoids quite well.  Please see the "Patient Instructions" section for addition details about the plan.   Rob Buntinganiel Camesha Farooq, MD [facility] 01/30/2019, 3:25 PM  Cc: Marcelle OverlieGrewal, Michelle, MD

## 2019-01-30 NOTE — Patient Instructions (Signed)
Please call our office to schedule your colonoscopy once you know your work schedule  Thank you for entrusting me with your care and choosing [facility].  Dr Ardis Hughs

## 2019-02-01 ENCOUNTER — Telehealth: Payer: Self-pay | Admitting: Gastroenterology

## 2019-02-01 NOTE — Telephone Encounter (Signed)
I reviewed her myriad genetics profile sent by Dr. Tressia Danas.  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  NCCN and the myriad genetics literature both state that there are no medical management guidelines in this situation and so she should be considered at routine risk for colon cancer.  Please let her know that the plan still stands that she has a colonoscopy since she has not had one yet.  If it is completely normal she does not need another one for 10 years

## 2019-02-01 NOTE — Telephone Encounter (Signed)
lmom for patient to call back 

## 2019-02-01 NOTE — Telephone Encounter (Signed)
I spoke to patient to inform her that Dr Ardis Hughs reviewed her genetics profile sent by Dr Mayme Genta his recommendations. She stated at this time she was not able to schedule her colonoscopy. She will call the office at a later date to have this done. All questions answered,patient voiced understanding.

## 2019-03-13 ENCOUNTER — Other Ambulatory Visit: Payer: Self-pay | Admitting: Obstetrics and Gynecology

## 2019-03-13 DIAGNOSIS — Z1231 Encounter for screening mammogram for malignant neoplasm of breast: Secondary | ICD-10-CM

## 2019-04-10 ENCOUNTER — Ambulatory Visit: Payer: No Typology Code available for payment source

## 2019-04-10 ENCOUNTER — Ambulatory Visit
Admission: RE | Admit: 2019-04-10 | Discharge: 2019-04-10 | Disposition: A | Discharge status: Home / Self Care | Payer: Self-pay | Source: Ambulatory Visit | Attending: Obstetrics and Gynecology | Admitting: Obstetrics and Gynecology

## 2019-04-10 ENCOUNTER — Other Ambulatory Visit: Payer: Self-pay

## 2019-04-10 DIAGNOSIS — Z1231 Encounter for screening mammogram for malignant neoplasm of breast: Secondary | ICD-10-CM

## 2020-04-09 ENCOUNTER — Other Ambulatory Visit: Payer: Self-pay | Admitting: Obstetrics and Gynecology

## 2020-04-09 DIAGNOSIS — Z1231 Encounter for screening mammogram for malignant neoplasm of breast: Secondary | ICD-10-CM

## 2020-04-15 ENCOUNTER — Other Ambulatory Visit: Payer: Self-pay

## 2020-04-15 ENCOUNTER — Ambulatory Visit
Admission: RE | Admit: 2020-04-15 | Discharge: 2020-04-15 | Disposition: A | Discharge status: Home / Self Care | Payer: No Typology Code available for payment source | Source: Ambulatory Visit | Attending: Obstetrics and Gynecology | Admitting: Obstetrics and Gynecology

## 2020-04-15 DIAGNOSIS — Z1231 Encounter for screening mammogram for malignant neoplasm of breast: Secondary | ICD-10-CM

## 2021-07-30 ENCOUNTER — Other Ambulatory Visit: Payer: Self-pay | Admitting: Obstetrics and Gynecology

## 2021-07-30 DIAGNOSIS — Z1231 Encounter for screening mammogram for malignant neoplasm of breast: Secondary | ICD-10-CM

## 2021-08-04 ENCOUNTER — Ambulatory Visit
Admission: RE | Admit: 2021-08-04 | Discharge: 2021-08-04 | Disposition: A | Discharge status: Home / Self Care | Payer: PRIVATE HEALTH INSURANCE | Source: Ambulatory Visit | Attending: Obstetrics and Gynecology | Admitting: Obstetrics and Gynecology

## 2021-08-04 DIAGNOSIS — Z1231 Encounter for screening mammogram for malignant neoplasm of breast: Secondary | ICD-10-CM

## 2022-11-16 ENCOUNTER — Other Ambulatory Visit: Payer: Self-pay | Admitting: Obstetrics and Gynecology

## 2022-11-16 DIAGNOSIS — Z1231 Encounter for screening mammogram for malignant neoplasm of breast: Secondary | ICD-10-CM

## 2022-12-07 ENCOUNTER — Ambulatory Visit
Admission: RE | Admit: 2022-12-07 | Discharge: 2022-12-07 | Disposition: A | Discharge status: Home / Self Care | Payer: PRIVATE HEALTH INSURANCE | Source: Ambulatory Visit | Attending: Obstetrics and Gynecology | Admitting: Obstetrics and Gynecology

## 2022-12-07 DIAGNOSIS — Z1231 Encounter for screening mammogram for malignant neoplasm of breast: Secondary | ICD-10-CM

## 2023-09-26 ENCOUNTER — Other Ambulatory Visit: Payer: Self-pay | Admitting: Obstetrics and Gynecology

## 2023-09-26 DIAGNOSIS — Z1231 Encounter for screening mammogram for malignant neoplasm of breast: Secondary | ICD-10-CM

## 2023-12-13 ENCOUNTER — Ambulatory Visit
Admission: RE | Admit: 2023-12-13 | Discharge: 2023-12-13 | Disposition: A | Discharge status: Home / Self Care | Payer: PRIVATE HEALTH INSURANCE | Source: Ambulatory Visit | Attending: Obstetrics and Gynecology | Admitting: Obstetrics and Gynecology

## 2023-12-13 DIAGNOSIS — Z1231 Encounter for screening mammogram for malignant neoplasm of breast: Secondary | ICD-10-CM

## 2024-02-01 NOTE — Telephone Encounter (Signed)
 Called pt to discuss. No answer. Left VM that 5mg  dose of Zepbound will be sent to pharmacy as well as Zofran  8 mg for nausea. Advised decreasing intake of fatty foods as this may worsen nausea. Requested call back if any further concerns.

## 2024-02-06 NOTE — Progress Notes (Signed)
 " [facility] Initial Consultation   Referring Provider Mat Browning, MD [address] [place],  [place] [address]  Primary Care Provider Mat Browning, MD  Patient Profile: Susan Thompson is a 58 y.o. female who is seen in consultation in the [facility] at the request of Dr. Mat for evaluation and management of the problem(s) noted below.  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  History of Present Illness Susan Thompson is a 58 year old female with a past medical history noteworthy for constipation and MUTYH mutation who presents for a colonoscopy consultation.  Colorectal cancer risk assessment - Personal history of MUTYH mutation -per Dr. Laurena notes 2020 patient has a mono allelic mutation that is heterozygous and does not increase her risk of colon cancer-can follow guidelines for general population - Family history significant for multiple cancers and polyps: aunt with bilateral breast cancer, uterine cancer, and brain cancer; both parents with polyps - Mother underwent right hemicolectomy for a large colonic polyp - 4 cm TVA in setting of Crohn's disease - Father had a history of colon polyps  Bowel habit changes - Chronic constipation for the past ten years, requiring daily or every other day use of Miralax  to prevent straining - Intermittent episodes of diarrhea - Mother has Crohn's disease, contributing to concern for gastrointestinal pathology  Hemorrhoidal symptoms - Hemorrhoids causing discomfort without associated rectal bleeding  Gastroesophageal reflux symptoms - Recent recurrence of heartburn, managed  with over-the-counter Tums - History of hiatal hernia repair - Father with history of heartburn and esophageal polyp  Nausea and motion sickness - Nausea and motion sickness, particularly with anesthesia - Tolerated propofol anesthesia well in the past  Relevant surgical history - History of lap band procedure, cholecystectomy, appendectomy, and hiatal hernia repair   GI Review of Symptoms Significant for constipation, GERD. Otherwise negative.  General Review of Systems  Review of systems is significant for the pertinent positives and negatives as listed per the HPI.  Full ROS is otherwise negative.  Past Medical History   Past Medical History:  Diagnosis Date   Anxiety    Arthritis    Depression    Hemorrhoids    History of migraine    Hyperlipidemia    Rectal bleeding    Rectal pain      Past Surgical History   Past Surgical History:  Procedure Laterality Date   APPENDECTOMY  1982   CHOLECYSTECTOMY  1997   LAPAROSCOPIC GASTRIC BANDING  2005   TONSILLECTOMY AND ADENOIDECTOMY  1999     Allergies and Medications   Allergies  Allergen Reactions   Flagyl [Metronidazole Hcl] Hives    Hives will spread all over the body. Had to use epi pen to resolve because the hives were so bad. Breathing not affected.    Current Meds  Medication Sig   cetirizine (ZYRTEC) 5 MG chewable tablet Chew 5 mg by mouth daily.   citalopram (CELEXA) 20 MG tablet Take 20 mg by mouth.   hydroxychloroquine (PLAQUENIL) 200 MG tablet Take 200 mg by mouth daily.   propranolol (INDERAL) 10 MG tablet Take 10 mg by mouth 2 (two) times daily.   spironolactone (ALDACTONE) 100 MG  tablet Take 100 mg by mouth daily.     Family History   Family History  Problem Relation Age of Onset   Colon polyps Mother    Depression Father    Colon polyps Father    Breast cancer Maternal Aunt 51     Social History   Social History   Tobacco Use   Smoking status: Never  Vaping Use   Vaping status:  Never Used  Substance Use Topics   Alcohol use: No    Comment: rarely   Drug use: No   Rexine reports that she has never smoked. She does not have any smokeless tobacco history on file. She reports that she does not drink alcohol and does not use drugs.  Vital Signs and Physical Examination   Vitals:   02/07/24 1435  BP: 104/72  Pulse: 72   Body mass index is 26.7 kg/m. Weight: 158 lb (71.7 kg)  General: Well developed, well nourished, no acute distress Head: Normocephalic and atraumatic Eyes: Sclerae anicteric, EOMI Lungs: Clear throughout to auscultation Heart: Regular rate and rhythm; No murmurs, rubs or bruits Abdomen: Soft, non tender and non distended. No masses, hepatosplenomegaly or hernias noted. Normal Bowel sounds Rectal: Deferred Musculoskeletal: Symmetrical with no gross deformities    Review of Data  The following data was reviewed at the time of this encounter:  Laboratory Studies      Latest Ref Rng & Units 07/13/2013    9:10 PM 09/02/2010    2:51 PM  CBC  WBC 4.0 - 10.5 K/uL 15.5  6.5   Hemoglobin 12.0 - 15.0 g/dL 85.3  85.1   Hematocrit 36.0 - 46.0 % 44.4  43.0   Platelets 150 - 400 K/uL 332  272     Lab Results  Component Value Date   LIPASE 44 07/13/2013      Latest Ref Rng & Units 07/13/2013    9:10 PM  CMP  Glucose 70 - 99 mg/dL 867   BUN 6 - 23 mg/dL 16   Creatinine 9.49 - 1.10 mg/dL 9.29   Sodium 862 - 852 mEq/L 139   Potassium 3.7 - 5.3 mEq/L 3.8   Chloride 96 - 112 mEq/L 100   CO2 19 - 32 mEq/L 20   Calcium 8.4 - 10.5 mg/dL 9.5   Total Protein 6.0 - 8.3 g/dL 8.3   Total Bilirubin 0.3 - 1.2 mg/dL 0.5   Alkaline Phos 39 - 117 U/L 91   AST 0 - 37 U/L 13   ALT 0 - 35 U/L 11      Imaging Studies  None  GI Procedures and Studies  None  Clinical Impression  It is my clinical impression that Ms. Dhami is a 58 y.o. 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  She has never had a colonoscopy.  She has a personal history of a mono allelic heterozygous mutation in the MUTYH gene.  This was checked due to a family history of an aunt with breast, uterine and brain cancer as well as parents with colon polyps.  Dr. Teressa previously discussed this result with another provider who counseled that this type of mutation does not necessarily increase Ms. Masters's risk of colorectal cancer. Her mother has a pertinent history of a 4 cm right colon TVA that  required right hemicolectomy; her father also had colon polyps.  These familial factors do place her at higher risk of colon polyps and colon cancer. She has had a history of constipation that is well-controlled with MiraLAX .  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  Ms. Prezioso also has a history of GERD that is very mild.  She notes intermittent symptoms depending upon dietary intake.  She uses Tums on a as needed basis.  There are no red flag symptoms of dysphagia, odynophagia or refractory GERD to warrant an EGD at this time.  Plan  Schedule screening colonoscopy at [facility] -pending those results further recommendations can be made regarding follow-up intervals.  If colonoscopy is normal would recommend a 5-year follow-up colonoscopy due to her family history of advanced adenoma in her mother. Continue dietary and lifestyle modification for management of GERD Tums as needed Continue MiraLAX  as needed  Planned Follow Up PRN  The patient or caregiver verbalized understanding of the material covered, with no barriers to understanding. All questions were answered. Patient or caregiver is agreeable with the plan outlined above.    It was a pleasure to see  Hser.  If you have any questions or concerns regarding this evaluation, do not hesitate to contact me.  Inocente Hausen, MD [facility]  "

## 2024-02-07 ENCOUNTER — Ambulatory Visit: Payer: PRIVATE HEALTH INSURANCE | Admitting: Pediatrics

## 2024-02-07 ENCOUNTER — Encounter: Payer: Self-pay | Admitting: Pediatrics

## 2024-02-07 VITALS — BP 104/72 | HR 72 | Ht 64.5 in | Wt 158.0 lb

## 2024-02-07 DIAGNOSIS — K5900 Constipation, unspecified: Secondary | ICD-10-CM | POA: Diagnosis not present

## 2024-02-07 DIAGNOSIS — Z1211 Encounter for screening for malignant neoplasm of colon: Secondary | ICD-10-CM

## 2024-02-07 DIAGNOSIS — Z83719 Family history of colon polyps, unspecified: Secondary | ICD-10-CM

## 2024-02-07 DIAGNOSIS — K219 Gastro-esophageal reflux disease without esophagitis: Secondary | ICD-10-CM

## 2024-02-07 MED ORDER — NA SULFATE-K SULFATE-MG SULF 17.5-3.13-1.6 GM/177ML PO SOLN: 1.0000 | Freq: Once | ORAL | 0 refills | Status: AC

## 2024-02-07 NOTE — Patient Instructions (Signed)
 You have been scheduled for a colonoscopy. Please follow written instructions given to you at your visit today.   If you use inhalers (even only as needed), please bring them with you on the day of your procedure.  DO NOT TAKE 7 DAYS PRIOR TO TEST- Trulicity (dulaglutide) Ozempic, Wegovy (semaglutide) Mounjaro (tirzepatide) Bydureon Bcise (exanatide extended release)  DO NOT TAKE 1 DAY PRIOR TO YOUR TEST Rybelsus (semaglutide) Adlyxin (lixisenatide) Victoza (liraglutide) Byetta (exanatide) ___________________________________________________________________________   Thank you for entrusting me with your care and for choosing [facility], Dr. Inocente Hausen   _______________________________________________________  If your blood pressure at your visit was 140/90 or greater, please contact your primary care physician to follow up on this.  _______________________________________________________  If you are age 61 or older, your body mass index should be between 23-30. Your Body mass index is 26.7 kg/m. If this is out of the aforementioned range listed, please consider follow up with your Primary Care Provider.  If you are age 27 or younger, your body mass index should be between 19-25. Your Body mass index is 26.7 kg/m. If this is out of the aformentioned range listed, please consider follow up with your Primary Care Provider.   ________________________________________________________  The [facility] providers would like to encourage you to use MYCHART to communicate with providers for non-urgent requests or questions.  Due to long hold times on the telephone, sending your provider a message by [facility] may be a faster and more efficient way to get a response.  Please allow 48 business hours for a response.  Please remember that this is for non-urgent requests.  _______________________________________________________  [facility] is using a team-based approach  to care.  Your team is made up of your doctor and two to three APPS. Our APPS (Nurse Practitioners and Physician Assistants) work with your physician to ensure care continuity for you. They are fully qualified to address your health concerns and develop a treatment plan. They communicate directly with your gastroenterologist to care for you. Seeing the Advanced Practice Practitioners on your physician's team can help you by facilitating care more promptly, often allowing for earlier appointments, access to diagnostic testing, procedures, and other specialty referrals.

## 2024-02-08 ENCOUNTER — Encounter: Payer: Self-pay | Admitting: Pediatrics

## 2024-03-11 ENCOUNTER — Encounter: Payer: Self-pay | Admitting: Pediatrics

## 2024-03-17 NOTE — Progress Notes (Deleted)
 [facility] History and Physical   Primary Care Physician:  Mat Browning, MD   Reason for Procedure:  Colorectal cancer screening, incidental notation of once to patient  Plan:    Colonoscopy    HPI: Susan Thompson is a 58 y.o. female undergoing colonoscopy for colorectal cancer screening.  Patient has never had a colonoscopy.  There is a pertinent family history of her mother having an advanced adenoma-underwent right hemicolectomy for 4 cm right colon TVA.  Her father's also had colon polyps.  The patient reports a longstanding history of chronic constipation dating back 10 years managed with MiraLAX .  Has intermittent episodes of diarrhea.  No rectal bleeding.  Also noted to have a mono allelic heterozygous mutation in the MUYTH gene -this is not felt to increase her risk of colorectal cancer   Past Medical History:  Diagnosis Date   Anxiety    Arthritis    Depression    Hemorrhoids    History of migraine    HTN (hypertension)    Hyperlipidemia    Rectal bleeding    Rectal pain     Past Surgical History:  Procedure Laterality Date   APPENDECTOMY  1982   CHOLECYSTECTOMY  1997   LAPAROSCOPIC GASTRIC BANDING  2005   TONSILLECTOMY AND ADENOIDECTOMY  1999    Prior to Admission medications   Medication Sig Start Date End Date Taking? Authorizing Provider  buPROPion (WELLBUTRIN SR) 150 MG 12 hr tablet Take 150 mg by mouth 2 (two) times daily. Patient not taking: Reported on 02/07/2024    [provider]  cetirizine (ZYRTEC) 5 MG chewable tablet Chew 5 mg by mouth daily.    [provider]  citalopram (CELEXA) 20 MG tablet Take 20 mg by mouth. 08/16/23   [provider]  hydroxychloroquine (PLAQUENIL) 200 MG tablet Take 200 mg by mouth daily.    [provider]  polyethylene glycol powder (GLYCOLAX /MIRALAX ) powder TAKE 17 GRAMS BY MOUTH DAILY TO CORRECT CONSTIPATION Patient not taking: Reported on 02/07/2024 09/06/12   Aron Shoulders, MD  progesterone (PROMETRIUM) 200 MG capsule Take 200 mg by mouth daily. Patient not taking: Reported on 02/07/2024    [provider]  propranolol (INDERAL) 10 MG tablet Take 10 mg by mouth 2 (two) times daily.    [provider]  spironolactone (ALDACTONE) 100 MG tablet Take 100 mg by mouth daily.    [provider]    Current Outpatient Medications  Medication Sig Dispense Refill   buPROPion (WELLBUTRIN SR) 150 MG 12 hr tablet Take 150 mg by mouth 2 (two) times daily. (Patient not taking: Reported on 02/07/2024)     cetirizine (ZYRTEC) 5 MG chewable tablet Chew 5 mg by mouth daily.     citalopram (CELEXA) 20 MG tablet Take 20 mg by mouth.     hydroxychloroquine (PLAQUENIL) 200 MG tablet Take 200 mg by mouth daily.     polyethylene glycol powder (GLYCOLAX /MIRALAX ) powder TAKE 17 GRAMS BY MOUTH DAILY TO CORRECT CONSTIPATION (Patient not taking: Reported on 02/07/2024) 255 g 3   progesterone (PROMETRIUM) 200 MG capsule Take 200 mg by mouth daily. (Patient not taking: Reported on 02/07/2024)     propranolol (INDERAL) 10 MG tablet Take 10 mg by mouth 2 (two) times daily.     spironolactone (ALDACTONE) 100 MG tablet Take 100 mg by mouth daily.     No current facility-administered medications for this visit.    Allergies as of 03/18/2024 - Review Complete 02/08/2024  Allergen Reaction Noted   Flagyl [metronidazole hcl] Hives 08/17/2011    Family History  Problem Relation Age of Onset   Colon polyps Mother    Depression Father    Colon polyps Father    Breast cancer Maternal Aunt 28    Social History   Socioeconomic History   Marital status: Married    Spouse name: Not on file   Number of children: Not on file   Years of education: Not on file   Highest education level: Not on file  Occupational History    Employer: [employer]  Tobacco Use   Smoking status: Never   Smokeless tobacco: Not on file  Vaping Use   Vaping  status: Never Used  Substance and Sexual Activity   Alcohol use: No    Comment: rarely   Drug use: No   Sexual activity: Not Currently  Other Topics Concern   Not on file  Social History Narrative   Not on file   Social Drivers of Health   Financial Resource Strain: Not on file  Food Insecurity: Not on file  Transportation Needs: Not on file  Physical Activity: Not on file  Stress: Not on file  Social Connections: Not on file  Intimate Partner Violence: Not on file    Review of Systems:  All other review of systems negative except as mentioned in the HPI.  Physical Exam: Vital signs There were no vitals taken for this visit.  General:   Alert,  Well-developed, well-nourished, pleasant and cooperative in NAD Airway:  Mallampati  Lungs:  Clear throughout to auscultation.   Heart:  Regular rate and rhythm; no murmurs, clicks, rubs,  or gallops. Abdomen:  Soft, nontender and nondistended. Normal bowel sounds.   Neuro/Psych:  Normal mood and affect. A and O x 3  Inocente Hausen, MD [facility]

## 2024-03-18 ENCOUNTER — Encounter: Payer: PRIVATE HEALTH INSURANCE | Admitting: Pediatrics

## 2024-03-18 ENCOUNTER — Telehealth: Payer: Self-pay | Admitting: Pediatrics

## 2024-03-18 NOTE — Telephone Encounter (Signed)
 Patient stated she was unable to prep due to mother being ill.

## 2024-03-18 NOTE — Telephone Encounter (Signed)
 Good Morning Dr.McGreal,  Patient called and stated she will need to cancel today's procedure at 9:30 am due to mother being sick and unable to leave her. Patient also stated she was unable to prep for procedure.  Please advise  Thank you

## 2024-04-10 ENCOUNTER — Ambulatory Visit: Payer: PRIVATE HEALTH INSURANCE | Admitting: Physical Therapy
